# Patient Record
Sex: Female | Born: 1975 | Hispanic: Yes | Smoking: Former smoker
Health system: Southern US, Community
[De-identification: ages and names within clinical notes are randomized; demographics above are authoritative.]

## PROBLEM LIST (undated history)

## (undated) ENCOUNTER — Ambulatory Visit: Admission: EM | Discharge status: Absent without leave | Payer: Self-pay

## (undated) DIAGNOSIS — E559 Vitamin D deficiency, unspecified: Secondary | ICD-10-CM

## (undated) DIAGNOSIS — M533 Sacrococcygeal disorders, not elsewhere classified: Secondary | ICD-10-CM

## (undated) DIAGNOSIS — Z8742 Personal history of other diseases of the female genital tract: Secondary | ICD-10-CM

## (undated) DIAGNOSIS — J45909 Unspecified asthma, uncomplicated: Secondary | ICD-10-CM

## (undated) DIAGNOSIS — Z87828 Personal history of other (healed) physical injury and trauma: Secondary | ICD-10-CM

## (undated) DIAGNOSIS — K219 Gastro-esophageal reflux disease without esophagitis: Secondary | ICD-10-CM

## (undated) HISTORY — DX: Personal history of other diseases of the female genital tract: Z87.42

## (undated) HISTORY — PX: ABDOMINAL HYSTERECTOMY: SHX81

## (undated) HISTORY — DX: Personal history of other (healed) physical injury and trauma: Z87.828

## (undated) HISTORY — DX: Gastro-esophageal reflux disease without esophagitis: K21.9

## (undated) HISTORY — DX: Vitamin D deficiency, unspecified: E55.9

## (undated) HISTORY — DX: Sacrococcygeal disorders, not elsewhere classified: M53.3

## (undated) HISTORY — PX: NASAL POLYP SURGERY: SHX186

## (undated) HISTORY — DX: Unspecified asthma, uncomplicated: J45.909

---

## 2013-12-16 HISTORY — PX: KNEE SURGERY: SHX244

## 2021-09-21 DIAGNOSIS — N838 Other noninflammatory disorders of ovary, fallopian tube and broad ligament: Secondary | ICD-10-CM

## 2021-09-21 HISTORY — DX: Other noninflammatory disorders of ovary, fallopian tube and broad ligament: N83.8

## 2021-10-09 ENCOUNTER — Telehealth: Payer: Self-pay | Admitting: *Deleted

## 2021-10-09 NOTE — Telephone Encounter (Signed)
Spoke with the patient and scheduled a new patient appt for 11/4 at 9 am; patient given an arrival time of 8:30 am. Patient given the address for the clinic

## 2021-10-16 ENCOUNTER — Encounter: Payer: Self-pay | Admitting: Gynecologic Oncology

## 2021-10-16 HISTORY — PX: ABDOMINAL HYSTERECTOMY: SHX81

## 2021-10-17 ENCOUNTER — Telehealth: Payer: Self-pay

## 2021-10-17 NOTE — Telephone Encounter (Signed)
Received message from [facility] scheduling. Patient called and spoke with scheduling to cancel her appointment for Friday 10/19/21. Per scheduler the patient is having emergency surgery in [place] and is leaving the country today. She will call once she is back in the country to reschedule and will send her medical records to the cancer center.

## 2021-10-19 ENCOUNTER — Ambulatory Visit: Payer: Self-pay | Admitting: Gynecologic Oncology

## 2021-10-19 DIAGNOSIS — R1900 Intra-abdominal and pelvic swelling, mass and lump, unspecified site: Secondary | ICD-10-CM

## 2021-10-29 ENCOUNTER — Inpatient Hospital Stay
Admission: RE | Admit: 2021-10-29 | Discharge: 2021-10-29 | Disposition: A | Discharge status: Home / Self Care | Payer: Self-pay | Source: Ambulatory Visit | Attending: Gynecologic Oncology | Admitting: Gynecologic Oncology

## 2021-10-29 ENCOUNTER — Other Ambulatory Visit (HOSPITAL_COMMUNITY): Payer: Self-pay | Admitting: Gynecologic Oncology

## 2021-10-29 DIAGNOSIS — C801 Malignant (primary) neoplasm, unspecified: Secondary | ICD-10-CM

## 2022-04-25 ENCOUNTER — Emergency Department (HOSPITAL_BASED_OUTPATIENT_CLINIC_OR_DEPARTMENT_OTHER)
Admission: EM | Admit: 2022-04-25 | Discharge: 2022-04-25 | Disposition: A | Discharge status: Home / Self Care | Payer: Federal, State, Local not specified - PPO | Attending: Emergency Medicine | Admitting: Emergency Medicine

## 2022-04-25 ENCOUNTER — Emergency Department (HOSPITAL_BASED_OUTPATIENT_CLINIC_OR_DEPARTMENT_OTHER): Payer: Federal, State, Local not specified - PPO

## 2022-04-25 ENCOUNTER — Encounter (HOSPITAL_BASED_OUTPATIENT_CLINIC_OR_DEPARTMENT_OTHER): Payer: Self-pay | Admitting: Urology

## 2022-04-25 ENCOUNTER — Other Ambulatory Visit: Payer: Self-pay

## 2022-04-25 DIAGNOSIS — R600 Localized edema: Secondary | ICD-10-CM | POA: Diagnosis not present

## 2022-04-25 DIAGNOSIS — M7989 Other specified soft tissue disorders: Secondary | ICD-10-CM | POA: Diagnosis not present

## 2022-04-25 DIAGNOSIS — R609 Edema, unspecified: Secondary | ICD-10-CM

## 2022-04-25 LAB — COMPREHENSIVE METABOLIC PANEL
ALT: 12 U/L (ref 0–44)
AST: 16 U/L (ref 15–41)
Albumin: 3.8 g/dL (ref 3.5–5.0)
Alkaline Phosphatase: 62 U/L (ref 38–126)
Anion gap: 6 (ref 5–15)
BUN: 14 mg/dL (ref 6–20)
CO2: 22 mmol/L (ref 22–32)
Calcium: 8.7 mg/dL — ABNORMAL LOW (ref 8.9–10.3)
Chloride: 108 mmol/L (ref 98–111)
Creatinine, Ser: 0.75 mg/dL (ref 0.44–1.00)
GFR, Estimated: >60 mL/min (ref >60)
Glucose, Bld: 114 mg/dL — ABNORMAL HIGH (ref 70–99)
Potassium: 3.7 mmol/L (ref 3.5–5.1)
Sodium: 136 mmol/L (ref 135–145)
Total Bilirubin: 0.5 mg/dL (ref 0.3–1.2)
Total Protein: 7.1 g/dL (ref 6.5–8.1)

## 2022-04-25 LAB — CBC WITH DIFFERENTIAL/PLATELET
Abs Immature Granulocytes: 0.03 10*3/uL (ref 0.00–0.07)
Basophils Absolute: 0.1 10*3/uL (ref 0.0–0.1)
Basophils Relative: 1 %
Eosinophils Absolute: 0.6 10*3/uL — ABNORMAL HIGH (ref 0.0–0.5)
Eosinophils Relative: 7 %
HCT: 42 % (ref 36.0–46.0)
Hemoglobin: 14.3 g/dL (ref 12.0–15.0)
Immature Granulocytes: 0 %
Lymphocytes Relative: 20 %
Lymphs Abs: 1.7 10*3/uL (ref 0.7–4.0)
MCH: 28.3 pg (ref 26.0–34.0)
MCHC: 34 g/dL (ref 30.0–36.0)
MCV: 83.2 fL (ref 80.0–100.0)
Monocytes Absolute: 0.7 10*3/uL (ref 0.1–1.0)
Monocytes Relative: 8 %
Neutro Abs: 5.3 10*3/uL (ref 1.7–7.7)
Neutrophils Relative %: 64 %
Platelets: 317 10*3/uL (ref 150–400)
RBC: 5.05 MIL/uL (ref 3.87–5.11)
RDW: 13.8 % (ref 11.5–15.5)
WBC: 8.4 10*3/uL (ref 4.0–10.5)
nRBC: 0 % (ref 0.0–0.2)

## 2022-04-25 LAB — BRAIN NATRIURETIC PEPTIDE: B Natriuretic Peptide: 32.3 pg/mL (ref 0.0–100.0)

## 2022-04-25 NOTE — ED Notes (Signed)
Pt. Has lower extremity edema from the knee down bilat.     ? ?No reports of chest pain and no reports of shortness of breath noted by the Pt.  ? ?Pt. Reports she had an extremely lon plane ride yesterday from [place] to [place]. ?

## 2022-04-25 NOTE — Discharge Instructions (Addendum)
We discussed the results of your labs on today's visit.  I do recommend you elevate your feet especially after a long day of travel. ? ?Please avoid foods with salt.  You may follow-up with your primary care physician if you need to be started on a fluid pill. ?

## 2022-04-25 NOTE — ED Triage Notes (Signed)
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 ?

## 2022-04-25 NOTE — ED Notes (Signed)
Pt. Has positive pedal pulses bialt. ?

## 2022-04-25 NOTE — ED Provider Notes (Signed)
?[facility] ?Provider Note ? ? ?CSN: [id number] ?Arrival date & time: 04/25/22  1812 ? ?  ? ?History ? ?Chief Complaint  ?Patient presents with  ? Leg Swelling  ? ? ?Suzanne Bowen is a 46 y.o. female. ? ?46 y.o female with a PMH of uterine mass presents to the ED with a chief complaint of bilateral leg swelling that began 4 hours prior to arrival.  Patient reports she does travel from [place], had about a 10-hour day, flying for 6-1/2 hours on air total.  She reports worsening swelling noted to her legs, states "I usually have very skinny legs ".  Reports similar symptoms in the past when she had "her tumor found".  She did not try anything for improvement in her symptoms.  She is concerned as she did not have any follow-up after the removal of her prior benign uterine mass.  She has not follow-up with any oncologist since this visit.  She does not have any prior history of blood clots, no prior history of CAD. No trauma, no shortness of breath or chest pain, no prior hx of CHF.  ? ?The history is provided by the patient and medical records.  ? ?  ? ?Home Medications ?Prior to Admission medications   ?Medication Sig Start Date End Date Taking? Authorizing Provider  ?ALBUTEROL IN Inhale 2 puffs into the lungs every 4 (four) hours as needed.    [provider]  ?Cetirizine HCl (ZYRTEC PO) Take by mouth.    [provider]  ?diphenhydrAMINE HCl (BENADRYL ALLERGY PO) Take by mouth.    [provider]  ?EPINEPHrine 0.3 mg/0.3 mL IJ SOAJ injection Inject 0.3 mg into the muscle as needed for anaphylaxis.    [provider]  ?Fexofenadine HCl (ALLEGRA PO) Take by mouth.    [provider]  ?fluticasone (FLONASE) 50 MCG/ACT nasal spray Place into both nostrils daily.    [provider]  ?   ? ?Allergies    ?Patient has no known allergies.   ? ?Review of Systems   ?Review of Systems  ?Constitutional:  Negative for chills and fever.  ?HENT:   Negative for sore throat.   ?Respiratory:  Positive for shortness of breath.   ?Cardiovascular:  Positive for leg swelling. Negative for chest pain.  ?Gastrointestinal:  Negative for abdominal pain, nausea and vomiting.  ?Genitourinary:  Negative for flank pain.  ?Neurological:  Negative for headaches.  ?All other systems reviewed and are negative. ? ?Physical Exam ?Updated Vital Signs ?BP 135/80 (BP Location: Right Arm)   Pulse 78   Temp 98.1 ?F (36.7 ?C) (Oral)   Resp 20   Ht '5\' 10"'$  (1.778 m)   Wt 95.3 kg   SpO2 100%   BMI 30.13 kg/m?  ?Physical Exam ?Vitals and nursing note reviewed.  ?Constitutional:   ?   Appearance: Normal appearance.  ?HENT:  ?   Head: Normocephalic and atraumatic.  ?   Mouth/Throat:  ?   Mouth: Mucous membranes are moist.  ?Cardiovascular:  ?   Rate and Rhythm: Normal rate.  ?   Pulses:     ?     Popliteal pulses are 2+ on the right side and 2+ on the left side.  ?     Dorsalis pedis pulses are 2+ on the right side and 2+ on the left side.  ?   Comments: No BL pitting edema. No calf tenderness. Reports more swollen than baseline.  ?Pulmonary:  ?  Effort: Pulmonary effort is normal.  ?Abdominal:  ?   General: Abdomen is flat.  ?Musculoskeletal:     ?   General: No swelling or tenderness.  ?   Cervical back: Normal range of motion and neck supple.  ?   Right lower leg: No edema.  ?   Left lower leg: No edema.  ?Skin: ?   General: Skin is warm and dry.  ?Neurological:  ?   Mental Status: She is alert and oriented to person, place, and time.  ? ? ?ED Results / Procedures / Treatments   ?Labs ?(all labs ordered are listed, but only abnormal results are displayed) ?Labs Reviewed  ?CBC WITH DIFFERENTIAL/PLATELET  ?COMPREHENSIVE METABOLIC PANEL  ?BRAIN NATRIURETIC PEPTIDE  ? ? ?EKG ?None ? ?Radiology ?No results found. ? ?Procedures ?Procedures  ? ? ?Medications Ordered in ED ?Medications - No data to display ? ?ED Course/ Medical Decision Making/ A&P ?  ?                        ?Medical  Decision Making ?Patient presents to the ED with bilateral leg swelling that is been ongoing for the past 4 hours, recently traveled from [place], states she had a total travel of 6.5 hours, but had a total number of 10 hours while at the airport.  She is concerned as she feels some tingling to bilateral legs.  She denies any trauma.  She is concerned as prior symptoms are similar in the past when she was diagnosed with a uterine mass which was then removed.  She has not follow-up with oncology since although this mass was benign. ? ?During evaluation she is nontoxic, non-ill-appearing.  Lungs are clear to auscultation she is without any shortness of breath or chest pain at this time.  Bilateral legs noted to be swollen, however without any edema noted, no calf tenderness bilaterally she is able to fully range bilateral lower extremities with good strength.  Currently on OCPs that she had a partial hysterectomy, denies any prior history of blood clots.  No fevers, changes in her skin to suggest cellulitis. ? ?We did discuss ruling out DVT versus CHF versus just peripheral edema. ?BNP is in normal limits, DVT study is negative for any thrombus.  Patient did ask about swelling, I do believe that this will improve her elevation and discontinue some sodium.  I did discuss with her that I am unable to place her on medication such as Lasix as patient does not have any PCP for follow-up and cannot get repeat anabolic panels drawn. ? ?We did discuss follow-up while she returns to [place] along with follow-up with [facility] oncologist.  She is in stable condition ready for discharge. ? ?Amount and/or Complexity of Data Reviewed ?Labs: ordered. ?Radiology: ordered. ? ?Risk ?Diagnosis or treatment significantly limited by social determinants of health. ? ?This patient presents to the ED for concern of leg swelling, this involves a number of treatment options, and is a complaint that carries with it a high risk of complications and  morbidity.  The differential diagnosis includes DVT, cellulitis, trauma, peripheral edema.  ? ? ?Co morbidities: ?Discussed in HPI ? ? ?Lab Tests: ? ?I ordered and independently interpreted labs.  The pertinent results include:   ? ?I personally reviewed all laboratory work and imaging. Metabolic panel without any acute abnormality specifically kidney function within normal limits and no significant electrolyte abnormalities. CBC without leukocytosis or significant anemia. ? ? ?Imaging Studies: ? ?  DVT study did not show any obvious thrombus. ? ? ?Medicines ordered: ? ?N/A ? ?Social Determinants of Health: ? ?The patient's social determinants of health were a factor in the care of this patient ? ? ?Dispostion: ? ?After consideration of the diagnostic results and the patients response to treatment, I feel that the patent would benefit from patient follow-up with PCP and Dr. Berline Lopes primary oncologist from prior uterine mass. ?  ? ?Portions of this note were generated with [employer]. Dictation errors may occur despite best attempts at proofreading.   ?Final Clinical Impression(s) / ED Diagnoses ?Final diagnoses:  ?Peripheral edema  ? ? ?Rx / DC Orders ?ED Discharge Orders   ? ? None  ? ?  ? ? ?  ?Janeece Fitting, PA-C ?04/25/22 2023 ? ?  ?Malvin Johns, MD ?04/25/22 2306 ? ?

## 2022-04-25 NOTE — ED Notes (Signed)
Family updated as to patient's status.

## 2022-06-06 ENCOUNTER — Encounter: Payer: Self-pay | Admitting: Internal Medicine

## 2022-06-06 ENCOUNTER — Ambulatory Visit (INDEPENDENT_AMBULATORY_CARE_PROVIDER_SITE_OTHER): Payer: Federal, State, Local not specified - PPO | Admitting: Internal Medicine

## 2022-06-06 VITALS — BP 110/60 | HR 71 | Temp 98.0°F | Resp 17 | Wt 239.9 lb

## 2022-06-06 DIAGNOSIS — J309 Allergic rhinitis, unspecified: Secondary | ICD-10-CM | POA: Diagnosis not present

## 2022-06-06 DIAGNOSIS — J302 Other seasonal allergic rhinitis: Secondary | ICD-10-CM

## 2022-06-06 DIAGNOSIS — H1013 Acute atopic conjunctivitis, bilateral: Secondary | ICD-10-CM | POA: Diagnosis not present

## 2022-06-06 DIAGNOSIS — H1010 Acute atopic conjunctivitis, unspecified eye: Secondary | ICD-10-CM

## 2022-06-06 DIAGNOSIS — J4520 Mild intermittent asthma, uncomplicated: Secondary | ICD-10-CM | POA: Diagnosis not present

## 2022-06-06 MED ORDER — MONTELUKAST SODIUM 10 MG PO TABS: 10.0000 mg | ORAL_TABLET | Freq: Every day | ORAL | 1 refills | Status: DC

## 2022-06-06 MED ORDER — ALBUTEROL SULFATE HFA 108 (90 BASE) MCG/ACT IN AERS: 2.0000 | INHALATION_SPRAY | Freq: Four times a day (QID) | RESPIRATORY_TRACT | 2 refills | Status: AC | PRN

## 2022-06-06 MED ORDER — FEXOFENADINE HCL 180 MG PO TABS
180.0000 mg | ORAL_TABLET | Freq: Every day | ORAL | 1 refills | Status: DC
Start: 2022-06-06 — End: 2023-02-14

## 2022-06-06 NOTE — Progress Notes (Signed)
New Patient Note  RE: Ernestyne Caldwell MRN: [id number] DOB: 24-Jun-1976 Date of Office Visit: 06/06/2022  Consult requested by: No ref. provider found Primary care provider: Pcp, No  Chief Complaint: Other (Pt states she is present to find a doctor to take over her care of allergic rhinitis, and AIT shots just move here) and Allergic Rhinitis   History of Present Illness: I had the pleasure of seeing Keyonni Knope for initial evaluation at the [facility] [place] [facility] [place] on 06/06/2022. She is a 46 y.o. female, who is referred here by Pcp, No for the evaluation of allergic rhinitis.  History obtained from patient.  Chronic rhinitis: started as a child  Symptoms include: nasal congestion, rhinorrhea, post nasal drainage, sneezing, watery eyes, itchy eyes, and itchy nose  Occurs year-round, with seasonal flares in springtime and with dog exposure  Potential triggers: pollen season, cats and dogs  Treatments tried: benadryl, unknown nose sprays (will get tried after nose sprays)  AIT: Started on 04/2022: does have mild local reactions,  Previous allergy testing:  outside testing: T, G, W, C, D, Dm, M History of reflux/heartburn: no History of chronic sinusitis or sinus surgery:  2 previous sinus surgeries (2009. 2012)  Nonallergic triggers:  Denies     Has a history of exercise induced chest tightness and cough.  Was started on montelukast and albuterol for 6 months with complete resolution of symptoms.  Has stopped exercising so she does not know if symptoms would recur.     Assessment and Plan: Patryce is a 46 y.o. female with: Seasonal and perennial allergic rhinitis  Seasonal allergic conjunctivitis  Mild intermittent asthma without complication - Plan: Spirometry with Graph Plan: Patient Instructions  Seasonal and perennial rhinitis: Moderately controlled  - Continue Avoidance measures for trees, grass, weeds, cat, dog, mold, dust mite - AIT PLAN: We will continue  with once a week buildup on current vials.  -Based on outside prescription she will reached maintenance at  0.5 mL of red vial   -Continue to carry EpiPen on shot days  -Once your vials expire we will need to get refills from previous allergist  - Stop taking: Benadryl  - Start taking: Allegra (fexofenadine) '180mg'$  table once daily and Singulair (montelukast) '10mg'$  daily - You can use an extra dose of the antihistamine, if needed, for breakthrough symptoms.  - Consider nasal saline rinses 1-2 times daily to remove allergens from the nasal cavities as well as help with mucous clearance (this is especially helpful to do before the nasal sprays are given) - Consider allergy shots as a means of long-term control and can reduce lifetime use of medications  - We can discuss more at the next appointment if the medications are not working for you.  Mild intermittent asthma: Well controlled - Breathing test today showed: Looked great  PLAN: . - Daily controller medication(s): Singulair '10mg'$  daily - Prior to physical activity: albuterol 2 puffs 10-15 minutes before physical activity. - Rescue medications: albuterol 4 puffs every 4-6 hours as needed - Asthma control goals:  * Full participation in all desired activities (may need albuterol before activity) * Albuterol use two time or less a week on average (not counting use with activity) * Cough interfering with sleep two time or less a month * Oral steroids no more than once a year * No hospitalizations  Follow up: 6 months   Thank you so much for letting me partake in your care today.  Don't hesitate to  reach out if you have any additional concerns!  Roney Marion, MD  [facility] [place] [place]    No follow-ups on file.  Meds ordered this encounter  Medications   albuterol (VENTOLIN HFA) 108 (90 Base) MCG/ACT inhaler    Sig: Inhale 2 puffs into the lungs every 6 (six) hours as needed for wheezing or shortness of  breath.    Dispense:  8 g    Refill:  2   montelukast (SINGULAIR) 10 MG tablet    Sig: Take 1 tablet (10 mg total) by mouth at bedtime.    Dispense:  90 tablet    Refill:  1   fexofenadine (ALLEGRA) 180 MG tablet    Sig: Take 1 tablet (180 mg total) by mouth daily.    Dispense:  90 tablet    Refill:  1   Lab Orders  No laboratory test(s) ordered today    Other allergy screening: Asthma:  YES  Rhino conjunctivitis: yes Food allergy: no Medication allergy: no Hymenoptera allergy: no Urticaria: no Eczema:no History of recurrent infections suggestive of immunodeficency: no  Diagnostics: Spirometry:  Tracings reviewed. Her effort: Good reproducible efforts. FVC: 3.2 L FEV1: 2.58 L, 80% predicted FEV1/FVC ratio: 81% Interpretation: Spirometry consistent with normal pattern.  Please see scanned spirometry results for details.   Results interpreted by myself and discussed with patient/family.   Past Medical History: There are no problems to display for this patient.  Past Medical History:  Diagnosis Date   History of PCOS    History of torn meniscus of left knee    Ovarian mass, right 09/21/2021   Sacroiliac pain    Past Surgical History: Past Surgical History:  Procedure Laterality Date   ABDOMINAL HYSTERECTOMY     Medication List:  Current Outpatient Medications  Medication Sig Dispense Refill   albuterol (VENTOLIN HFA) 108 (90 Base) MCG/ACT inhaler Inhale 2 puffs into the lungs every 6 (six) hours as needed for wheezing or shortness of breath. 8 g 2   ALBUTEROL IN Inhale 2 puffs into the lungs every 4 (four) hours as needed.     budesonide-formoterol (SYMBICORT) 160-4.5 MCG/ACT inhaler Inhale into the lungs.     Cetirizine HCl (ZYRTEC PO) Take by mouth.     diphenhydrAMINE HCl (BENADRYL ALLERGY PO) Take by mouth.     EPINEPHrine 0.3 mg/0.3 mL IJ SOAJ injection Inject 0.3 mg into the muscle as needed for anaphylaxis.     fexofenadine (ALLEGRA) 180 MG tablet  Take 1 tablet (180 mg total) by mouth daily. 90 tablet 1   Fexofenadine HCl (ALLEGRA PO) Take by mouth.     fluticasone (FLONASE) 50 MCG/ACT nasal spray Place into both nostrils daily.     montelukast (SINGULAIR) 10 MG tablet Take 1 tablet (10 mg total) by mouth at bedtime. 90 tablet 1   No current facility-administered medications for this visit.   Allergies: No Known Allergies Social History: Social History   Socioeconomic History   Marital status: Single    Spouse name: Not on file   Number of children: Not on file   Years of education: Not on file   Highest education level: Not on file  Occupational History   Not on file  Tobacco Use   Smoking status: Never   Smokeless tobacco: Never  Substance and Sexual Activity   Alcohol use: Yes    Comment: socially   Drug use: Never   Sexual activity: Not on file  Other Topics Concern  Not on file  Social History Narrative   Not on file   Social Determinants of Health   Financial Resource Strain: Not on file  Food Insecurity: Not on file  Transportation Needs: Not on file  Physical Activity: Not on file  Stress: Not on file  Social Connections: Not on file   Lives in a single-family home that is 47 years old.  There are no roaches in the house and bed is 2 feet off the floor.  There are no dust mite precautions on better pillow.  She is exposed to fumes, chemicals and dust through her job.  She does not have a HEPA filter and does not live near an interstate industrial area. Smoking: No exposure Occupation: Works as an Education officer, museum History: Environmental education officer in the house: no Charity fundraiser in the family room: no Carpet in the bedroom: no Heating: electric Cooling:  window Pet: yes dogs outside the building, cats located inside of building but not bedroom.   Family History: History reviewed. No pertinent family history.   ROS: All others negative except as noted per HPI.   Objective: BP  110/60   Pulse 71   Temp 98 F (36.7 C) (Temporal)   Resp 17   Wt 239 lb 14.4 oz (108.8 kg)   SpO2 98%   BMI 34.42 kg/m  Body mass index is 34.42 kg/m.  General Appearance:  Alert, cooperative, no distress, appears stated age  Head:  Normocephalic, without obvious abnormality, atraumatic  Eyes:  Conjunctiva clear, EOM's intact  Nose: Nares normal,  erythematous nasal mucosa with green rhinnorhea, hypertrophic turbinates, no visible anterior polyps, and septum midline  Throat: Lips, tongue normal; teeth and gums normal, + cobblestoning  Neck: Supple, symmetrical  Lungs:   clear to auscultation bilaterally, Respirations unlabored, no coughing  Heart:  regular rate and rhythm and no murmur, Appears well perfused  Extremities: No edema  Skin: Skin color, texture, turgor normal, no rashes or lesions on visualized portions of skin  Neurologic: No gross deficits   The plan was reviewed with the patient/family, and all questions/concerned were addressed.  It was my pleasure to see Annabell today and participate in her care. Please feel free to contact me with any questions or concerns.  Sincerely,  Roney Marion, MD Allergy & Immunology  [facility] office: [phone] [facility] office: [phone]

## 2022-06-06 NOTE — Patient Instructions (Addendum)
Seasonal and perennial rhinitis: Moderately controlled  - Continue Avoidance measures for trees, grass, weeds, cat, dog, mold, dust mite - AIT PLAN: We will continue with once a week buildup on current vials.  -Based on outside prescription she will reached maintenance at  0.5 mL of red vial   -Continue to carry EpiPen on shot days  -Once your vials expire we will need to get refills from previous allergist  - Stop taking: Benadryl  - Start taking: Allegra (fexofenadine) '180mg'$  table once daily and Singulair (montelukast) '10mg'$  daily - You can use an extra dose of the antihistamine, if needed, for breakthrough symptoms.  - Consider nasal saline rinses 1-2 times daily to remove allergens from the nasal cavities as well as help with mucous clearance (this is especially helpful to do before the nasal sprays are given) - Consider allergy shots as a means of long-term control and can reduce lifetime use of medications  - We can discuss more at the next appointment if the medications are not working for you.  Mild intermittent asthma: Well controlled - Breathing test today showed: Looked great  PLAN: . - Daily controller medication(s): Singulair '10mg'$  daily - Prior to physical activity: albuterol 2 puffs 10-15 minutes before physical activity. - Rescue medications: albuterol 4 puffs every 4-6 hours as needed - Asthma control goals:  * Full participation in all desired activities (may need albuterol before activity) * Albuterol use two time or less a week on average (not counting use with activity) * Cough interfering with sleep two time or less a month * Oral steroids no more than once a year * No hospitalizations  Follow up: 6 months   Thank you so much for letting me partake in your care today.  Don't hesitate to reach out if you have any additional concerns!  Roney Marion, MD  [facility] [place], [place]

## 2022-06-07 ENCOUNTER — Other Ambulatory Visit: Payer: Self-pay

## 2022-06-07 ENCOUNTER — Emergency Department (HOSPITAL_BASED_OUTPATIENT_CLINIC_OR_DEPARTMENT_OTHER): Payer: Federal, State, Local not specified - PPO

## 2022-06-07 ENCOUNTER — Encounter (HOSPITAL_BASED_OUTPATIENT_CLINIC_OR_DEPARTMENT_OTHER): Payer: Self-pay

## 2022-06-07 ENCOUNTER — Emergency Department (HOSPITAL_BASED_OUTPATIENT_CLINIC_OR_DEPARTMENT_OTHER)
Admission: EM | Admit: 2022-06-07 | Discharge: 2022-06-07 | Disposition: A | Discharge status: Home / Self Care | Payer: Federal, State, Local not specified - PPO | Attending: Emergency Medicine | Admitting: Emergency Medicine

## 2022-06-07 DIAGNOSIS — W19XXXA Unspecified fall, initial encounter: Secondary | ICD-10-CM

## 2022-06-07 DIAGNOSIS — W109XXA Fall (on) (from) unspecified stairs and steps, initial encounter: Secondary | ICD-10-CM | POA: Insufficient documentation

## 2022-06-07 DIAGNOSIS — S99912A Unspecified injury of left ankle, initial encounter: Secondary | ICD-10-CM | POA: Diagnosis not present

## 2022-06-07 DIAGNOSIS — M791 Myalgia, unspecified site: Secondary | ICD-10-CM

## 2022-06-07 DIAGNOSIS — M79642 Pain in left hand: Secondary | ICD-10-CM | POA: Diagnosis not present

## 2022-06-07 DIAGNOSIS — S93402A Sprain of unspecified ligament of left ankle, initial encounter: Secondary | ICD-10-CM | POA: Diagnosis not present

## 2022-06-07 DIAGNOSIS — Z043 Encounter for examination and observation following other accident: Secondary | ICD-10-CM | POA: Diagnosis not present

## 2022-06-07 DIAGNOSIS — Y9301 Activity, walking, marching and hiking: Secondary | ICD-10-CM | POA: Diagnosis not present

## 2022-06-07 DIAGNOSIS — M7989 Other specified soft tissue disorders: Secondary | ICD-10-CM | POA: Diagnosis not present

## 2022-06-07 MED ORDER — METHOCARBAMOL 500 MG PO TABS: 500.0000 mg | ORAL_TABLET | Freq: Every evening | ORAL | 0 refills | Status: AC | PRN

## 2022-06-07 MED ORDER — IBUPROFEN 800 MG PO TABS
800.0000 mg | ORAL_TABLET | Freq: Once | ORAL | Status: AC
  Administered 2022-06-07: 800 mg via ORAL
  Filled 2022-06-07: qty 1

## 2022-06-07 NOTE — ED Triage Notes (Signed)
Patient states she was walking down the stairs and missed a step and fell. Patient c/o left hand pain and left leg pain - able to walk but states is painful.

## 2022-06-14 ENCOUNTER — Ambulatory Visit (INDEPENDENT_AMBULATORY_CARE_PROVIDER_SITE_OTHER): Payer: Federal, State, Local not specified - PPO

## 2022-06-14 DIAGNOSIS — J309 Allergic rhinitis, unspecified: Secondary | ICD-10-CM

## 2022-06-19 ENCOUNTER — Ambulatory Visit (INDEPENDENT_AMBULATORY_CARE_PROVIDER_SITE_OTHER): Payer: Federal, State, Local not specified - PPO

## 2022-06-19 DIAGNOSIS — J309 Allergic rhinitis, unspecified: Secondary | ICD-10-CM | POA: Diagnosis not present

## 2022-09-05 ENCOUNTER — Telehealth: Payer: Self-pay

## 2022-09-05 NOTE — Telephone Encounter (Signed)
Patient notified of vial concentration change due to length of time of last injection.  Vials will be diluted and ready to restart next week.  Also informed patient the vials will expire in December, so she will need retesting done unless she chooses to make yearly visit to old allergist in [place].

## 2022-09-10 ENCOUNTER — Encounter (HOSPITAL_BASED_OUTPATIENT_CLINIC_OR_DEPARTMENT_OTHER): Payer: Self-pay | Admitting: Emergency Medicine

## 2022-09-10 ENCOUNTER — Other Ambulatory Visit: Payer: Self-pay

## 2022-09-10 ENCOUNTER — Emergency Department (HOSPITAL_BASED_OUTPATIENT_CLINIC_OR_DEPARTMENT_OTHER)
Admission: EM | Admit: 2022-09-10 | Discharge: 2022-09-10 | Disposition: A | Discharge status: Home / Self Care | Payer: Federal, State, Local not specified - PPO | Attending: Emergency Medicine | Admitting: Emergency Medicine

## 2022-09-10 DIAGNOSIS — S71151A Open bite, right thigh, initial encounter: Secondary | ICD-10-CM | POA: Diagnosis not present

## 2022-09-10 DIAGNOSIS — S81831A Puncture wound without foreign body, right lower leg, initial encounter: Secondary | ICD-10-CM | POA: Insufficient documentation

## 2022-09-10 DIAGNOSIS — S8991XA Unspecified injury of right lower leg, initial encounter: Secondary | ICD-10-CM | POA: Diagnosis not present

## 2022-09-10 DIAGNOSIS — W540XXA Bitten by dog, initial encounter: Secondary | ICD-10-CM | POA: Insufficient documentation

## 2022-09-10 DIAGNOSIS — S81851A Open bite, right lower leg, initial encounter: Secondary | ICD-10-CM | POA: Diagnosis not present

## 2022-09-10 MED ORDER — AMOXICILLIN-POT CLAVULANATE 875-125 MG PO TABS: 1.0000 | ORAL_TABLET | Freq: Two times a day (BID) | ORAL | 0 refills | Status: DC

## 2022-09-10 NOTE — ED Triage Notes (Signed)
Pt reports dog bite to RLE; dog is unknown to pt; owners said the dog is UTD on vaccines

## 2022-09-10 NOTE — ED Notes (Signed)
Wound cleaned per provider.

## 2022-09-10 NOTE — ED Notes (Signed)
Dog bite to the back of the right thigh. Patient states owner said Dog is vaccinated. 3 puncture wounds noted to the back of thigh. No active bleeding noted . Pt last tetanus was in November.

## 2022-09-10 NOTE — ED Provider Notes (Signed)
[facility] EMERGENCY DEPARTMENT Provider Note   CSN: [id number] Arrival date & time: 09/10/22  1940     History  Chief Complaint  Patient presents with   Animal Bite    Suzanne Bowen is a 46 y.o. female.  With no relevant past medical history who presents to the ED for evaluation of dog bite.  Reports dog bite happened at approximately 3 PM today.  Dog is not known to her.  States the owner believes vaccines are up-to-date.  Currently complaining of mild pain to the puncture site.  No numbness or tingling in the affected area.  No fevers or chills.   Animal Bite      Home Medications Prior to Admission medications   Medication Sig Start Date End Date Taking? Authorizing Provider  amoxicillin-clavulanate (AUGMENTIN) 875-125 MG tablet Take 1 tablet by mouth every 12 (twelve) hours. 09/10/22  Yes Curstin Schmale, Grafton Folk, PA-C  albuterol (VENTOLIN HFA) 108 (90 Base) MCG/ACT inhaler Inhale 2 puffs into the lungs every 6 (six) hours as needed for wheezing or shortness of breath. 06/06/22   Roney Marion, MD  ALBUTEROL IN Inhale 2 puffs into the lungs every 4 (four) hours as needed.    [provider]  budesonide-formoterol (SYMBICORT) 160-4.5 MCG/ACT inhaler Inhale into the lungs. 05/06/22   [provider]  Cetirizine HCl (ZYRTEC PO) Take by mouth.    [provider]  diphenhydrAMINE HCl (BENADRYL ALLERGY PO) Take by mouth.    [provider]  EPINEPHrine 0.3 mg/0.3 mL IJ SOAJ injection Inject 0.3 mg into the muscle as needed for anaphylaxis.    [provider]  fexofenadine (ALLEGRA) 180 MG tablet Take 1 tablet (180 mg total) by mouth daily. 06/06/22   Roney Marion, MD  Fexofenadine HCl (ALLEGRA PO) Take by mouth.    [provider]  fluticasone (FLONASE) 50 MCG/ACT nasal spray Place into both nostrils daily.    [provider]  methocarbamol (ROBAXIN) 500 MG tablet Take 1 tablet (500 mg total) by mouth at  bedtime as needed for muscle spasms. 06/07/22   Caccavale, Sophia, PA-C  montelukast (SINGULAIR) 10 MG tablet Take 1 tablet (10 mg total) by mouth at bedtime. 06/06/22   Roney Marion, MD      Allergies    Patient has no known allergies.    Review of Systems   Review of Systems  Skin:  Positive for wound.  All other systems reviewed and are negative.   Physical Exam Updated Vital Signs BP (!) 140/89 (BP Location: Right Arm)   Pulse 80   Temp 98.3 F (36.8 C) (Oral)   Resp 20   Ht '5\' 11"'$  (1.803 m)   Wt 108.9 kg   SpO2 99%   BMI 33.47 kg/m  Physical Exam Vitals and nursing note reviewed.  Constitutional:      General: She is not in acute distress.    Appearance: Normal appearance. She is normal weight. She is not ill-appearing.  HENT:     Head: Normocephalic and atraumatic.  Pulmonary:     Effort: Pulmonary effort is normal. No respiratory distress.  Abdominal:     General: Abdomen is flat.  Musculoskeletal:        General: Normal range of motion.     Cervical back: Neck supple.  Skin:    General: Skin is warm and dry.     Capillary Refill: Capillary refill takes less than 2 seconds.          Comments:  Tiny puncture wound to the right posterior lower extremity with no bleeding, drainage or surrounding erythema.  Neurological:     Mental Status: She is alert and oriented to person, place, and time.  Psychiatric:        Mood and Affect: Mood normal.        Behavior: Behavior normal.     ED Results / Procedures / Treatments   Labs (all labs ordered are listed, but only abnormal results are displayed) Labs Reviewed - No data to display  EKG None  Radiology No results found.  Procedures Procedures    Medications Ordered in ED Medications - No data to display  ED Course/ Medical Decision Making/ A&P                           Medical Decision Making Risk Prescription drug management.  This patient presents to the ED for concern of dog  bite   Additional history obtained from: Nursing notes from this visit.   Afebrile, hemodynamically stable.  Patient is a 47 year old female who presents ED for evaluation of dog bite to the right lower extremity.  Physical exam shows a single tiny puncture wound.  Not large enough to close.  Wound was thoroughly cleansed while in the ED.  We will send him a prescription for Augmentin.  Advised patient to follow-up with her primary care provider.  Given return precautions. stable at discharge.  At this time there does not appear to be any evidence of an acute emergency medical condition and the patient appears stable for discharge with appropriate outpatient follow up. Diagnosis was discussed with patient who verbalizes understanding of care plan and is agreeable to discharge. I have discussed return precautions with patient who verbalizes understanding. Patient encouraged to follow-up with their PCP within 1 week. All questions answered.  Note: Portions of this report may have been transcribed using voice recognition software. Every effort was made to ensure accuracy; however, inadvertent computerized transcription errors may still be present.          Final Clinical Impression(s) / ED Diagnoses Final diagnoses:  Dog bite, initial encounter    Rx / DC Orders ED Discharge Orders          Ordered    amoxicillin-clavulanate (AUGMENTIN) 875-125 MG tablet  Every 12 hours        09/10/22 2248              Roylene Reason, Hershal Coria 09/10/22 2337    Long, Wonda Olds, MD 09/16/22 1204

## 2022-09-10 NOTE — Discharge Instructions (Addendum)
You have been seen today for your complaint of dog bite. Your discharge medications include Augmentin.  This is an antibiotic.  You should take it as prescribed.  You should take it for the entire duration of the prescription.  It may cause an upset stomach.  You should take it with food.. Home care instructions are as follows:  Keep the wound clean and dry for 24 hours.  After that you may wash with warm soap and water daily.  Do not submerge the wound until it is fully healed. Follow up with: Your primary care provider in 1 week Please seek immediate medical care if you develop any of the following symptoms: You have a red streak that leads away from your wound. You have non-clear fluid or more blood coming from your wound. There is pus or a bad smell coming from your wound. You have trouble moving your injured area. You have numbness or tingling that spreads beyond your wound. At this time there does not appear to be the presence of an emergent medical condition, however there is always the potential for conditions to change. Please read and follow the below instructions.  Do not take your medicine if  develop an itchy rash, swelling in your mouth or lips, or difficulty breathing; call 911 and seek immediate emergency medical attention if this occurs.  You may review your lab tests and imaging results in their entirety on your MyChart account.  Please discuss all results of fully with your primary care provider and other specialist at your follow-up visit.  Note: Portions of this text may have been transcribed using voice recognition software. Every effort was made to ensure accuracy; however, inadvertent computerized transcription errors may still be present.

## 2022-09-12 ENCOUNTER — Ambulatory Visit: Payer: Self-pay

## 2022-09-12 ENCOUNTER — Ambulatory Visit (INDEPENDENT_AMBULATORY_CARE_PROVIDER_SITE_OTHER): Payer: Federal, State, Local not specified - PPO | Admitting: Family

## 2022-09-12 ENCOUNTER — Encounter: Payer: Self-pay | Admitting: Family

## 2022-09-12 VITALS — BP 124/76 | HR 75 | Temp 97.7°F | Resp 18 | Wt 242.1 lb

## 2022-09-12 DIAGNOSIS — H1013 Acute atopic conjunctivitis, bilateral: Secondary | ICD-10-CM

## 2022-09-12 DIAGNOSIS — J309 Allergic rhinitis, unspecified: Secondary | ICD-10-CM

## 2022-09-12 DIAGNOSIS — J4520 Mild intermittent asthma, uncomplicated: Secondary | ICD-10-CM | POA: Diagnosis not present

## 2022-09-12 DIAGNOSIS — J019 Acute sinusitis, unspecified: Secondary | ICD-10-CM | POA: Diagnosis not present

## 2022-09-12 DIAGNOSIS — H1010 Acute atopic conjunctivitis, unspecified eye: Secondary | ICD-10-CM

## 2022-09-12 DIAGNOSIS — J302 Other seasonal allergic rhinitis: Secondary | ICD-10-CM

## 2022-09-12 MED ORDER — PREDNISONE 10 MG PO TABS: ORAL_TABLET | ORAL | 0 refills | Status: DC

## 2022-09-12 MED ORDER — MONTELUKAST SODIUM 10 MG PO TABS: 10.0000 mg | ORAL_TABLET | Freq: Every day | ORAL | 1 refills | Status: DC

## 2022-09-12 NOTE — Patient Instructions (Addendum)
Acute sinus infection Start Augmentin 875 mg taking 1 tablet twice a day for 7 days that was prescribed by the ER on 09/10/22 for your dog bite. This antibiotic will also work for your sinuses. Make sure to pick this up from the pharmacy Start prednisone 10 mg taking 1 tablet twice a day for 4 days, then on the fifth day take 1 tablet and stop  Seasonal and perennial rhinitis: - Continue Avoidance measures for trees, grass, weeds, cat, dog, mold, dust mite - AIT PLAN: We will continue with once a week buildup on current vials. Do not get your allergy injections while you are sick    -Continue to carry EpiPen on shot days  -Once your vials expire consider getting skin testing at our office since you previously lived in [place] and this is where your serum is from - Continue Zyrtec (cetirizine) 10 mg table once daily andrestart Singulair (montelukast) '10mg'$  daily - You can use an extra dose of the antihistamine, if needed, for breakthrough symptoms.  - Consider nasal saline rinses 1-2 times daily to remove allergens from the nasal cavities as well as help with mucous clearance (this is especially helpful to do before the nasal sprays are given) - Consider allergy shots as a means of long-term control and can reduce lifetime use of medications  - We can discuss more at the next appointment if the medications are not working for you.  Mild intermittent asthma:  PLAN: . - Daily controller medication(s):Restart Singulair '10mg'$  daily - Prior to physical activity: albuterol 2 puffs 10-15 minutes before physical activity. - Rescue medications: albuterol 4 puffs every 4-6 hours as needed - Asthma control goals:  * Full participation in all desired activities (may need albuterol before activity) * Albuterol use two time or less a week on average (not counting use with activity) * Cough interfering with sleep two time or less a month * Oral steroids no more than once a year * No hospitalizations  We will  refer you to [facility] for possible sleep apnea due to witnessed apneas and fatigue when waking up in the morning  Follow up: 2 months or sooner if needed

## 2022-09-12 NOTE — Progress Notes (Signed)
[place] [address] Dept: [phone]  FOLLOW UP NOTE  Patient ID: Suzanne Bowen, female    DOB: [birth year]  Age: 46 y.o. MRN: [id number] Date of Office Visit: 09/12/2022  Assessment  Chief Complaint: Follow-up and Sinus Problem (Follow up Recurrent sinus infections, surgery twice feels septum moving has had polyps removed )  HPI Suzanne Bowen is a 46 year old female who presents today for an acute visit of possible sinus infection.  She was last seen on June 06, 2022 by Dr. Edison Pace for seasonal and perennial allergic rhinitis and mild intermittent asthma.   Since her last office visit she did go to the emergency room on September 26 for dog bite.  She has not picked up the Augmentin that was prescribed yet.  Seasonal and perennial allergic rhinitis: She reports nasal congestion, postnasal drip, sinus pressure, headache, and rhinorrhea that is green in color.  She denies fever.  She reports that most of the time when she blows her nose she will see green.  It has been ongoing for a while.  Occasionally when she sneezes what comes out will be clear in color.  She continues to take Zyrtec 10 mg once a day, but mentions that over-the-counter allergy pills only work for her 1 week and then they become ineffective.  She has been out of Singulair 10 mg for 2 months.  She continues to receive allergy injections based on her skin testing when she lived in [place].  She is not able to use nasal sprays due to them making her tired and short of breath.  She feels like she has ran a marathon after using nasal sprays.  She has tried every nasal spray that is on the market.  She will occasionally use fluticasone nasal spray.  She has had 2 previous sinus surgeries and plans on having a another one next May.  She mentions that the first surgery was for polyps and that they tried to remove the septum, but it moved again.  She receives antibiotics for sinus infection approximately 2 times  a year.  Mild intermittent asthma she is currently been out of Singulair 10 mg once a day for at least 2 months.  She does have albuterol to use as needed.  She reports coughing and wheezing sometimes and tightness in her chest and shortness of breath if running.  She uses her albuterol sometimes 2 times a week and other times she does not use it in a 2-week time span.  Since her last office visit she has not required any systemic steroids or made any trips to the emergency room or urgent care due to breathing problems.  She mentions that her girlfriend often sees her stopping breathing at night and some days she wakes up really tired.  She has not been evaluated for sleep apnea.  She does not have a primary care physician.   Drug Allergies:  Not on File  Review of Systems: Review of Systems  Constitutional:  Negative for chills and fever.  HENT:         Reports green rhinorrhea, nasal congestion, postnasal drip, and sinus pressure  Eyes:        Reports itchy watery eyes  Respiratory:  Positive for cough, shortness of breath and wheezing.        Reports coughing and wheezing sometimes.  She also reports tightness in her chest and shortness of breath if running.  Cardiovascular:  Positive for chest pain and palpitations.  Reports chest pain and palpitation at times. She does not have a primary care physician  Gastrointestinal:        Denies heartburn or reflux symptoms  Genitourinary:  Negative for frequency.  Skin:  Negative for itching and rash.  Neurological:  Negative for headaches.  Endo/Heme/Allergies:  Positive for environmental allergies.     Physical Exam: BP 124/76 (BP Location: Left Arm, Patient Position: Sitting, Cuff Size: Normal)   Pulse 75   Temp 97.7 F (36.5 C) (Temporal)   Resp 18   Wt 242 lb 1.6 oz (109.8 kg)   SpO2 99%   BMI 33.77 kg/m    Physical Exam Constitutional:      Appearance: Normal appearance.  HENT:     Head: Normocephalic and  atraumatic.     Comments: Pharynx: Mild cobblestoning noted.  Eyes normal, ears normal, nose: Bilateral lower turbinates moderately edematous and pale with clear drainage noted    Right Ear: Tympanic membrane, ear canal and external ear normal.     Left Ear: Tympanic membrane, ear canal and external ear normal.     Mouth/Throat:     Mouth: Mucous membranes are moist.     Pharynx: Oropharynx is clear.  Eyes:     Conjunctiva/sclera: Conjunctivae normal.  Cardiovascular:     Rate and Rhythm: Regular rhythm.     Heart sounds: Normal heart sounds.  Pulmonary:     Effort: Pulmonary effort is normal.     Breath sounds: Normal breath sounds.     Comments: Lungs clear to auscultation Musculoskeletal:     Cervical back: Neck supple.  Skin:    General: Skin is warm.  Neurological:     Mental Status: She is alert and oriented to person, place, and time.  Psychiatric:        Mood and Affect: Mood normal.        Behavior: Behavior normal.        Thought Content: Thought content normal.        Judgment: Judgment normal.     Diagnostics:  None. Will get at next office visit.  Assessment and Plan: 1. Acute non-recurrent sinusitis, unspecified location   2. Mild intermittent asthma without complication   3. Seasonal and perennial allergic rhinitis   4. Seasonal allergic conjunctivitis     Meds ordered this encounter  Medications   predniSONE (DELTASONE) 10 MG tablet    Sig: 1 tablet by mouth twice a day for 4 days, then on the fifth day take 1 tablet and stop    Dispense:  9 tablet    Refill:  0    Take one  tablet twice a day for 4 days then one tablet on day 5   montelukast (SINGULAIR) 10 MG tablet    Sig: Take 1 tablet (10 mg total) by mouth at bedtime.    Dispense:  90 tablet    Refill:  1    Patient Instructions  Acute sinus infection Start Augmentin 875 mg taking 1 tablet twice a day for 7 days that was prescribed by the ER on 09/10/22 for your dog bite. This antibiotic  will also work for your sinuses. Make sure to pick this up from the pharmacy Start prednisone 10 mg taking 1 tablet twice a day for 4 days, then on the fifth day take 1 tablet and stop  Seasonal and perennial rhinitis: - Continue Avoidance measures for trees, grass, weeds, cat, dog, mold, dust mite - AIT PLAN: We will continue with  once a week buildup on current vials. Do not get your allergy injections while you are sick    -Continue to carry EpiPen on shot days  -Once your vials expire consider getting skin testing at our office since you previously lived in [place] and this is where your serum is from - Continue Zyrtec (cetirizine) 10 mg table once daily andrestart Singulair (montelukast) '10mg'$  daily - You can use an extra dose of the antihistamine, if needed, for breakthrough symptoms.  - Consider nasal saline rinses 1-2 times daily to remove allergens from the nasal cavities as well as help with mucous clearance (this is especially helpful to do before the nasal sprays are given) - Consider allergy shots as a means of long-term control and can reduce lifetime use of medications  - We can discuss more at the next appointment if the medications are not working for you.  Mild intermittent asthma:  PLAN: . - Daily controller medication(s):Restart Singulair '10mg'$  daily - Prior to physical activity: albuterol 2 puffs 10-15 minutes before physical activity. - Rescue medications: albuterol 4 puffs every 4-6 hours as needed - Asthma control goals:  * Full participation in all desired activities (may need albuterol before activity) * Albuterol use two time or less a week on average (not counting use with activity) * Cough interfering with sleep two time or less a month * Oral steroids no more than once a year * No hospitalizations  We will refer you to [facility] for possible sleep apnea due to witnessed apneas and fatigue when waking up in the morning  Follow up: 2 months or  sooner if needed      Return in about 2 months (around 11/12/2022), or if symptoms worsen or fail to improve.    Thank you for the opportunity to care for this patient.  Please do not hesitate to contact me with questions.  Althea Charon, FNP [facility]

## 2022-09-18 ENCOUNTER — Ambulatory Visit (INDEPENDENT_AMBULATORY_CARE_PROVIDER_SITE_OTHER): Payer: Federal, State, Local not specified - PPO

## 2022-09-18 DIAGNOSIS — J309 Allergic rhinitis, unspecified: Secondary | ICD-10-CM | POA: Diagnosis not present

## 2022-09-26 ENCOUNTER — Telehealth: Payer: Self-pay

## 2022-09-26 NOTE — Telephone Encounter (Signed)
-----   Message from Althea Charon, [place] sent at 09/12/2022  1:09 PM EDT ----- Please refer to [facility] for possible sleep apena- Due to witnessed apneas and waking up really tired some morntings

## 2022-09-27 ENCOUNTER — Ambulatory Visit (INDEPENDENT_AMBULATORY_CARE_PROVIDER_SITE_OTHER): Payer: Federal, State, Local not specified - PPO

## 2022-09-27 DIAGNOSIS — J309 Allergic rhinitis, unspecified: Secondary | ICD-10-CM | POA: Diagnosis not present

## 2022-10-08 ENCOUNTER — Ambulatory Visit (INDEPENDENT_AMBULATORY_CARE_PROVIDER_SITE_OTHER): Payer: Federal, State, Local not specified - PPO

## 2022-10-08 DIAGNOSIS — J309 Allergic rhinitis, unspecified: Secondary | ICD-10-CM | POA: Diagnosis not present

## 2022-10-28 ENCOUNTER — Ambulatory Visit (INDEPENDENT_AMBULATORY_CARE_PROVIDER_SITE_OTHER): Payer: Federal, State, Local not specified - PPO | Admitting: Neurology

## 2022-10-28 ENCOUNTER — Encounter: Payer: Self-pay | Admitting: Neurology

## 2022-10-28 VITALS — BP 130/77 | HR 61 | Ht 70.0 in | Wt 241.0 lb

## 2022-10-28 DIAGNOSIS — R0681 Apnea, not elsewhere classified: Secondary | ICD-10-CM | POA: Diagnosis not present

## 2022-10-28 DIAGNOSIS — R519 Headache, unspecified: Secondary | ICD-10-CM

## 2022-10-28 DIAGNOSIS — R0683 Snoring: Secondary | ICD-10-CM | POA: Diagnosis not present

## 2022-10-28 DIAGNOSIS — G4719 Other hypersomnia: Secondary | ICD-10-CM | POA: Diagnosis not present

## 2022-10-28 DIAGNOSIS — G4730 Sleep apnea, unspecified: Secondary | ICD-10-CM

## 2022-10-28 DIAGNOSIS — E669 Obesity, unspecified: Secondary | ICD-10-CM

## 2022-10-28 NOTE — Patient Instructions (Signed)

## 2022-10-28 NOTE — Progress Notes (Signed)
Subjective:    Patient ID: Suzanne Bowen is a 46 y.o. female.  HPI    Star Age, MD, PhD [facility] [address], [address], [place] [address]  Dear Altha Harm,   I saw your patient, Suzanne Bowen, upon your kind request, in my Sleep clinic today for initial consultation of her sleep disorder, in particular, concern for underlying obstructive sleep apnea.  The patient is unaccompanied today.  As you know, [name] is a 46 year old female with an underlying medical history of allergies, asthma, PCOS, right ovarian mass, sacroiliac pain, left knee torn meniscus, vitamin D deficiency, and obesity, who reports snoring and excessive daytime somnolence as well as witnessed apneas.  I reviewed your office note from 09/12/2022.  Her Epworth sleepiness score is 14 out of 24, fatigue severity score is 45 out of 63.  She reports an approximately 3-year history of feeling tired during the day.  She has never had a sleep study.  She does not wake herself up from her snoring or gasping but has been told that she makes choking sounds.  She has trouble breathing from her nose, had nasal surgery twice, in 2005 and 2010.  She had septoplasty, polypectomy, turbinate surgery and rhinoplasty.  She is working on weight loss, in the past couple of months she has lost about 10 pounds.  She is single, divorced, she lives alone, no children.  She was diagnosed with low vitamin D in the past.  She drinks caffeine in the form of coffee, 1 cup in the morning, she has worked nights in the past, currently works day shift.  She is an Financial trader.  Bedtime is generally around 8 PM and rise time somewhere between 4:30 AM and 6 AM.  She has a history of jaw cramping or bruxism and had a bite guard but currently does not have 1.  She is not aware of any family history of sleep apnea.  She denies night to night nocturia but has woken up occasionally with a  headache.  She has no pets in the household.  She drinks alcohol rarely.  Her Past Medical History Is Significant For: Past Medical History:  Diagnosis Date   History of PCOS    History of torn meniscus of left knee    Ovarian mass, right 09/21/2021   Sacroiliac pain    Vitamin D deficiency     Her Past Surgical History Is Significant For: Past Surgical History:  Procedure Laterality Date   ABDOMINAL HYSTERECTOMY  10/2021   partial, left ovaries;  6 lb tumor removed   NASAL POLYP SURGERY     septoplasty[place]    Her Family History Is Significant For: Family History  Problem Relation Age of Onset   Other Mother        leg thrombosis   High blood pressure Father    Glaucoma Maternal Grandmother    Sleep apnea Neg Hx     Her Social History Is Significant For: Social History   Socioeconomic History   Marital status: Single    Spouse name: Not on file   Number of children: Not on file   Years of education: Not on file   Highest education level: Not on file  Occupational History   Not on file  Tobacco Use   Smoking status: Former    Years: 4.00    Types: Cigarettes   Smokeless tobacco: Never  Vaping Use   Vaping  Use: Not on file  Substance and Sexual Activity   Alcohol use: Not Currently    Comment: socially   Drug use: Never   Sexual activity: Not on file  Other Topics Concern   Not on file  Social History Narrative   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    Lives alone   Right handed   Caffeine: 1 cup in the morning    Social Determinants of Health   Financial Resource Strain: Not on file  Food Insecurity: Not on file  Transportation Needs: Not on file  Physical Activity: Not on file  Stress: Not on file  Social Connections: Not on file    Her Allergies Are:  Allergies  Allergen Reactions   Shellfish Allergy     Has had one positive allergy test before and most recently two negatives  :   Her Current  Medications Are:  Outpatient Encounter Medications as of 10/28/2022  Medication Sig   albuterol (VENTOLIN HFA) 108 (90 Base) MCG/ACT inhaler Inhale 2 puffs into the lungs every 6 (six) hours as needed for wheezing or shortness of breath.   ALBUTEROL IN Inhale 2 puffs into the lungs every 4 (four) hours as needed.   budesonide-formoterol (SYMBICORT) 160-4.5 MCG/ACT inhaler Inhale into the lungs.   EPINEPHrine 0.3 mg/0.3 mL IJ SOAJ injection Inject 0.3 mg into the muscle as needed for anaphylaxis.   fexofenadine (ALLEGRA) 180 MG tablet Take 1 tablet (180 mg total) by mouth daily. (Patient taking differently: Take 180 mg by mouth daily. When needed only)   fluticasone (FLONASE) 50 MCG/ACT nasal spray Place into both nostrils daily. As needed   methocarbamol (ROBAXIN) 500 MG tablet Take 1 tablet (500 mg total) by mouth at bedtime as needed for muscle spasms.   Cetirizine HCl (ZYRTEC PO) Take by mouth. (Patient not taking: Reported on 10/28/2022)   diphenhydrAMINE HCl (BENADRYL ALLERGY PO) Take by mouth. (Patient not taking: Reported on 10/28/2022)   montelukast (SINGULAIR) 10 MG tablet Take 1 tablet (10 mg total) by mouth at bedtime. (Patient not taking: Reported on 10/28/2022)   [DISCONTINUED] amoxicillin-clavulanate (AUGMENTIN) 875-125 MG tablet Take 1 tablet by mouth every 12 (twelve) hours. (Patient not taking: Reported on 10/28/2022)   [DISCONTINUED] Fexofenadine HCl (ALLEGRA PO) Take by mouth.   [DISCONTINUED] predniSONE (DELTASONE) 10 MG tablet 1 tablet by mouth twice a day for 4 days, then on the fifth day take 1 tablet and stop (Patient not taking: Reported on 10/28/2022)   No facility-administered encounter medications on file as of 10/28/2022.  :   Review of Systems:  Out of a complete 14 point review of systems, all are reviewed and negative with the exception of these symptoms as listed below:   Review of Systems  Neurological:        Patient is here alone for sleep consult. She  reports she has been having trouble sleeping at night and is tired during the day. She is told she snores. She denies having any history of sleep study. She has had nasal surgery before, last in 2010. Surgeries include polyp removal, septoplasty, rhinoplasty. ESS 14 and FSS 45.    Objective:  Neurological Exam  Physical Exam Physical Examination:   Vitals:   10/28/22 1408  BP: 130/77  Pulse: 61    General Examination: The patient is a very pleasant 46 y.o. female in no acute distress. She appears well-developed and well-nourished and well groomed.   HEENT: Normocephalic, atraumatic, pupils are equal, round and reactive  to light, extraocular tracking is good without limitation to gaze excursion or nystagmus noted. Hearing is grossly intact. Face is symmetric with normal facial animation. Speech is clear with no dysarthria noted. There is no hypophonia. There is no lip, neck/head, jaw or voice tremor. Neck is supple with full range of passive and active motion. There are no carotid bruits on auscultation. Oropharynx exam reveals: No signal mouth dryness, good dental hygiene, mild airway crowding secondary to small airway entry.  Tonsils on the smaller side, Mallampati class I.  Neck circumference of 15-1/8 inches, minimal overbite.  Tongue protrudes centrally and palate elevates symmetrically.  Nasal inspection reveals no significant mucosal bogginess or septal deviation but she does have noisy nasal breathing.   Chest: Clear to auscultation without wheezing, rhonchi or crackles noted.  Heart: S1+S2+0, regular and normal without murmurs, rubs or gallops noted.   Abdomen: Soft, non-tender and non-distended.  Extremities: There is no obvious edema in the distal lower extremities bilaterally.   Skin: Warm and dry without trophic changes noted.   Musculoskeletal: exam reveals no obvious joint deformities.   Neurologically:  Mental status: The patient is awake, alert and oriented in all 4  spheres. Her immediate and remote memory, attention, language skills and fund of knowledge are appropriate. There is no evidence of aphasia, agnosia, apraxia or anomia. Speech is clear with normal prosody and enunciation. Thought process is linear. Mood is normal and affect is normal.  Cranial nerves II - XII are as described above under HEENT exam.  Motor exam: Normal bulk, strength and tone is noted. There is no obvious action or resting tremor.  Fine motor skills and coordination: grossly intact.  Cerebellar testing: No dysmetria or intention tremor. There is no truncal or gait ataxia.  Sensory exam: intact to light touch in the upper and lower extremities.  Gait, station and balance: She stands easily. No veering to one side is noted. No leaning to one side is noted. Posture is age-appropriate and stance is narrow based. Gait shows normal stride length and normal pace. No problems turning are noted.   Assessment and Plan:  In summary, Eevie Witter is a very pleasant 46 y.o.-year old female with an underlying medical history of allergies, asthma, PCOS, right ovarian mass, sacroiliac pain, left knee torn meniscus, vitamin D deficiency, and obesity, whose history and physical exam are concerning for sleep disordered breathing, supporting a current working diagnosis of unspecified sleep apnea, with the main differential diagnoses of obstructive sleep apnea (OSA) versus upper airway resistance syndrome (UARS) versus central sleep apnea (CSA), or mixed sleep apnea. A laboratory attended sleep study is considered gold standard for evaluation of sleep disordered breathing and is recommended at this time and clinically justified.   I had a long chat with the patient about my findings and the diagnosis of sleep apnea, particularly OSA, its prognosis and treatment options. We talked about medical/conservative treatments, surgical interventions and non-pharmacological approaches for symptom control. I  explained, in particular, the risks and ramifications of untreated moderate to severe OSA, especially with respect to developing cardiovascular disease down the road, including congestive heart failure (CHF), difficult to treat hypertension, cardiac arrhythmias (particularly A-fib), neurovascular complications including TIA, stroke and dementia. Even type 2 diabetes has, in part, been linked to untreated OSA. Symptoms of untreated OSA may include (but may not be limited to) daytime sleepiness, nocturia (i.e. frequent nighttime urination), memory problems, mood irritability and suboptimally controlled or worsening mood disorder such as depression and/or anxiety, lack  of energy, lack of motivation, physical discomfort, as well as recurrent headaches, especially morning or nocturnal headaches. We talked about the importance of maintaining a healthy lifestyle and striving for healthy weight. I recommended the following at this time: sleep study.  I outlined the differences between a laboratory attended sleep study which is considered more comprehensive and accurate over the option of a home sleep test (HST); the latter may lead to underestimation of sleep disordered breathing in some instances and does not help with diagnosing upper airway resistance syndrome and is not accurate enough to diagnose primary central sleep apnea typically. I explained the different sleep test procedures to the patient in detail and also outlined possible surgical and non-surgical treatment options of OSA, including the use of a pressure airway pressure (PAP) device (ie CPAP, AutoPAP/APAP or BiPAP in certain circumstances), a custom-made dental device (aka oral appliance, which would require a referral to a specialist dentist or orthodontist typically, and is generally speaking not considered a good choice for patients with full dentures or edentulous state), upper airway surgical options, such as traditional UPPP (which is not considered  a first-line treatment) or the Inspire device (hypoglossal nerve stimulator, which would involve a referral for consultation with an ENT surgeon, after careful selection, following inclusion criteria). I explained the PAP treatment option to the patient in detail, as this is generally considered first-line treatment.  The patient indicated that she would be willing to try PAP therapy, if the need arises. I explained the importance of being compliant with PAP treatment, not only for insurance purposes but primarily to improve patient's symptoms symptoms, and for the patient's long term health benefit, including to reduce Her cardiovascular risks longer-term.    We will pick up our discussion about the next steps and treatment options after testing.  We will keep her posted as to the test results by phone call and/or MyChart messaging where possible.  We will plan to follow-up in sleep clinic accordingly as well.  I answered all her questions today and the patient was in agreement.   I encouraged her to call with any interim questions, concerns, problems or updates or email [place] through [place].  Generally speaking, sleep test authorizations may take up to 2 weeks, sometimes less, sometimes longer, the patient is encouraged to get in touch with [place] if they do not hear back from the sleep lab staff directly within the next 2 weeks.  Thank you very much for allowing me to participate in the care of this nice patient. If I can be of any further assistance to you please do not hesitate to call me at [phone].  Sincerely,   Star Age, MD, PhD

## 2022-11-14 ENCOUNTER — Telehealth: Payer: Self-pay | Admitting: Neurology

## 2022-11-14 NOTE — Telephone Encounter (Signed)
[employer] fed pending faxed notes

## 2022-12-02 NOTE — Telephone Encounter (Signed)
11/25/22 [employer] fed Josem Kaufmann: [id number] (exp. 11/25/22 to 12/24/22) for [place].  Kelly left vmail on 11/28/22.

## 2023-02-04 DIAGNOSIS — K56609 Unspecified intestinal obstruction, unspecified as to partial versus complete obstruction: Secondary | ICD-10-CM | POA: Insufficient documentation

## 2023-02-14 ENCOUNTER — Encounter: Payer: Self-pay | Admitting: Family Medicine

## 2023-02-14 ENCOUNTER — Ambulatory Visit: Payer: Federal, State, Local not specified - PPO | Admitting: Family Medicine

## 2023-02-14 VITALS — BP 123/81 | HR 78 | Ht 70.0 in | Wt 235.0 lb

## 2023-02-14 DIAGNOSIS — Z7689 Persons encountering health services in other specified circumstances: Secondary | ICD-10-CM

## 2023-02-14 DIAGNOSIS — R42 Dizziness and giddiness: Secondary | ICD-10-CM | POA: Diagnosis not present

## 2023-02-14 DIAGNOSIS — E559 Vitamin D deficiency, unspecified: Secondary | ICD-10-CM | POA: Diagnosis not present

## 2023-02-14 DIAGNOSIS — R631 Polydipsia: Secondary | ICD-10-CM

## 2023-02-14 DIAGNOSIS — Z8719 Personal history of other diseases of the digestive system: Secondary | ICD-10-CM

## 2023-02-14 NOTE — Progress Notes (Signed)
New Patient Office Visit  Subjective    Patient ID: Suzanne Bowen, female    DOB: [birth year]  Age: 47 y.o. MRN: [id number]  CC:  Chief Complaint  Patient presents with   Establish Care    Pt c/o vertigo when laying down pt states  right ear pain in one era , pt states she felt dizzy some of the vertigo has su    HPI Suzanne Bowen presents to establish care. PT IS NEW TO ME.  Pt recently moved from [place] but she use to work all over. She is a Chief Financial Officer for [employer]  Pt reports a week ago, she was in [place] and was in a class with work. She says everyone was sick there. She had a sinus infection.  She called [facility] and was prescribed Amoxicillin x 7 days. She finished this a few Fridays ago. She started the nettipot and humidifier. She reports a few days later, she says she ate and felt sick. She started vomiting. She went to ER and was diagnosed with bowel obstruction. She was admitted and stayed in hospital from 2/19-2/22. During that time, she had  NG tube and had IVFs. She got better and when she was discharged, she flew from [place] to [place]. She started feeling dizzy on this past Monday. She reports when she lays down in bed on her left side, she has spinning sensation. She read online and started doing maneuvers to help the crystals in her ear, this improved some.    Outpatient Encounter Medications as of 02/14/2023  Medication Sig   albuterol (VENTOLIN HFA) 108 (90 Base) MCG/ACT inhaler Inhale 2 puffs into the lungs every 6 (six) hours as needed for wheezing or shortness of breath.   budesonide-formoterol (SYMBICORT) 160-4.5 MCG/ACT inhaler Inhale into the lungs.   EPINEPHrine 0.3 mg/0.3 mL IJ SOAJ injection Inject 0.3 mg into the muscle as needed for anaphylaxis.   fluticasone (FLONASE) 50 MCG/ACT nasal spray Place into both nostrils daily. As needed   ipratropium (ATROVENT) 0.03 % nasal spray Place 1 spray into both nostrils 2 (two) times daily.    methocarbamol (ROBAXIN) 500 MG tablet Take 1 tablet (500 mg total) by mouth at bedtime as needed for muscle spasms. (Patient not taking: Reported on 02/14/2023)   [DISCONTINUED] ALBUTEROL IN Inhale 2 puffs into the lungs every 4 (four) hours as needed.   [DISCONTINUED] Cetirizine HCl (ZYRTEC PO) Take by mouth. (Patient not taking: Reported on 10/28/2022)   [DISCONTINUED] diphenhydrAMINE HCl (BENADRYL ALLERGY PO) Take by mouth. (Patient not taking: Reported on 10/28/2022)   [DISCONTINUED] fexofenadine (ALLEGRA) 180 MG tablet Take 1 tablet (180 mg total) by mouth daily. (Patient taking differently: Take 180 mg by mouth daily. When needed only)   [DISCONTINUED] montelukast (SINGULAIR) 10 MG tablet Take 1 tablet (10 mg total) by mouth at bedtime. (Patient not taking: Reported on 10/28/2022)   No facility-administered encounter medications on file as of 02/14/2023.    Past Medical History:  Diagnosis Date   Asthma    GERD (gastroesophageal reflux disease)    History of PCOS    History of torn meniscus of left knee    Ovarian mass, right 09/21/2021   Sacroiliac pain    Vitamin D deficiency     Past Surgical History:  Procedure Laterality Date   ABDOMINAL HYSTERECTOMY  10/2021   partial, left ovaries;  6 lb tumor removed   NASAL POLYP SURGERY     septoplasty, rhinoplasty twice in another country  Family History  Problem Relation Age of Onset   Other Mother        leg thrombosis   High blood pressure Father    Glaucoma Maternal Grandmother    Sleep apnea Neg Hx     Social History   Socioeconomic History   Marital status: Divorced    Spouse name: Not on file   Number of children: Not on file   Years of education: Not on file   Highest education level: Not on file  Occupational History   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  Tobacco Use   Smoking status: Former    Years: 4.00    Types: Cigarettes   Smokeless tobacco: Never  Scientific laboratory technician Use: Not on file  Substance and Sexual  Activity   Alcohol use: Not Currently    Comment: socially   Drug use: Never   Sexual activity: Not on file  Other Topics Concern   Not on file  Social History Narrative   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 Negative for abdominal pain, constipation, diarrhea and vomiting.  All other systems reviewed and are negative.       Objective    BP 123/81   Pulse 78   Ht '5\' 10"'$  (1.778 m)   Wt 235 lb (106.6 kg)   SpO2 99%   BMI 33.72 kg/m   Physical Exam Vitals and nursing note reviewed.  Constitutional:      Appearance: Normal appearance. She is normal weight.  HENT:     Head: Normocephalic and atraumatic.     Right Ear: Tympanic membrane, ear canal and external ear normal.     Left Ear: Tympanic membrane, ear canal and external ear normal.     Nose: Nose normal.     Mouth/Throat:     Mouth: Mucous membranes are dry.     Pharynx: Oropharynx is clear.  Eyes:     Extraocular Movements: Extraocular movements intact.     Conjunctiva/sclera: Conjunctivae normal.     Pupils: Pupils are equal, round, and reactive to light.  Cardiovascular:     Rate and Rhythm: Normal rate and regular rhythm.     Pulses: Normal pulses.     Heart sounds: Normal heart sounds.  Pulmonary:     Effort: Pulmonary effort is normal.     Breath sounds: Normal breath sounds.  Abdominal:     General: Abdomen is flat.  Musculoskeletal:        General: Normal range of motion.  Skin:    General: Skin is warm.     Capillary Refill: Capillary refill takes less than 2 seconds.   Neurological:     General: No focal deficit present.     Mental Status: She is alert and oriented to person, place, and time. Mental status is at baseline.  Psychiatric:        Mood and Affect: Mood normal.        Behavior: Behavior normal.  Thought Content: Thought content normal.        Judgment: Judgment normal.        Assessment & Plan:   Encounter to establish care with new doctor  Polydipsia -     Hemoglobin A1c  She has increased polydipsia and nausea, will screen A1c today.   History of small bowel obstruction  Vertigo -     Basic metabolic panel  Checking BMP to rule out dehydration with recent SBO. Advised to drink more water daily with evidence of dry tongue on exam.   Vitamin D deficiency -     VITAMIN D 25 Hydroxy (Vit-D Deficiency, Fractures)  Pt reports hx of low vitamin D and would like to get this checked today      No follow-ups on file.   Leeanne Rio, MD

## 2023-02-15 LAB — BASIC METABOLIC PANEL
BUN/Creatinine Ratio: 14 (ref 9–23)
BUN: 11 mg/dL (ref 6–24)
CO2: 23 mmol/L (ref 20–29)
Calcium: 9.8 mg/dL (ref 8.7–10.2)
Chloride: 100 mmol/L (ref 96–106)
Creatinine, Ser: 0.81 mg/dL (ref 0.57–1.00)
Glucose: 68 mg/dL — ABNORMAL LOW (ref 70–99)
Potassium: 5.3 mmol/L — ABNORMAL HIGH (ref 3.5–5.2)
Sodium: 138 mmol/L (ref 134–144)
eGFR: 90 mL/min/{1.73_m2} (ref >59)

## 2023-02-15 LAB — VITAMIN D 25 HYDROXY (VIT D DEFICIENCY, FRACTURES): Vit D, 25-Hydroxy: 16.7 ng/mL — ABNORMAL LOW (ref 30.0–100.0)

## 2023-02-15 LAB — HEMOGLOBIN A1C
Est. average glucose Bld gHb Est-mCnc: 114 mg/dL
Hgb A1c MFr Bld: 5.6 % (ref 4.8–5.6)

## 2023-02-25 ENCOUNTER — Other Ambulatory Visit: Payer: Self-pay | Admitting: Family Medicine

## 2023-02-25 ENCOUNTER — Encounter: Payer: Self-pay | Admitting: Family Medicine

## 2023-02-25 DIAGNOSIS — E559 Vitamin D deficiency, unspecified: Secondary | ICD-10-CM

## 2023-02-25 MED ORDER — VITAMIN D (ERGOCALCIFEROL) 1.25 MG (50000 UNIT) PO CAPS: 50000.0000 [IU] | ORAL_CAPSULE | ORAL | 1 refills | Status: AC

## 2023-06-16 ENCOUNTER — Encounter: Payer: Federal, State, Local not specified - PPO | Admitting: Family Medicine

## 2023-06-25 ENCOUNTER — Encounter: Payer: Federal, State, Local not specified - PPO | Admitting: Family Medicine

## 2023-07-14 DIAGNOSIS — Z0000 Encounter for general adult medical examination without abnormal findings: Secondary | ICD-10-CM | POA: Diagnosis not present

## 2023-07-14 DIAGNOSIS — K56609 Unspecified intestinal obstruction, unspecified as to partial versus complete obstruction: Secondary | ICD-10-CM | POA: Diagnosis not present

## 2023-07-14 DIAGNOSIS — H8110 Benign paroxysmal vertigo, unspecified ear: Secondary | ICD-10-CM | POA: Diagnosis not present

## 2023-07-14 DIAGNOSIS — M545 Low back pain, unspecified: Secondary | ICD-10-CM | POA: Diagnosis not present

## 2023-07-14 DIAGNOSIS — B351 Tinea unguium: Secondary | ICD-10-CM | POA: Diagnosis not present

## 2023-07-14 DIAGNOSIS — R634 Abnormal weight loss: Secondary | ICD-10-CM | POA: Diagnosis not present

## 2023-07-14 DIAGNOSIS — E559 Vitamin D deficiency, unspecified: Secondary | ICD-10-CM | POA: Diagnosis not present

## 2023-07-14 DIAGNOSIS — F32A Depression, unspecified: Secondary | ICD-10-CM | POA: Diagnosis not present

## 2023-07-21 NOTE — Progress Notes (Signed)
I, Stevenson Clinch, CMA acting as a scribe for Clementeen Graham, MD.  Suzanne Bowen is a 47 y.o. female who presents to [facility] at [facility] today for low back pain x 3 months. Pt locates pain to bilateral lower back. Feels swelling in the lower back at times. Has tried foam rolling with short-term relief. Pain radiates into the left hip and leg. Does a lot of desk work with job. Hx of left knee injury with surgical repair. Feels like lower back and leg sx are affecting gate. Heat has been more helpful than ice.    Radiating pain: yes LE numbness/tingling: LE weakness: left leg Aggravates: sitting for prolonged periods of time, any position over time.  Treatments tried: stretching, foam rolling, heat, cold, topical analgesics  She also notes pain and paresthesias and pain radiating down both upper extremities.  Pertinent review of systems: No fevers or chills  Relevant historical information: History of small bowel obstruction and large uterine fibroid removed.   Exam:  BP 122/86   Pulse 69   Ht 5\' 10"  (1.778 m)   Wt 218 lb 6.4 oz (99.1 kg)   SpO2 98%   BMI 31.34 kg/m  General: Well Developed, well nourished, and in no acute distress.   MSK: C-spine nontender palpation spinal midline. Normal cervical motion. Mildly positive Spurling's test. Upper extremity strength and reflexes are intact. Wrist bilaterally negative Tinel's and Phalen's test.  L-spine nontender palpation spinal midline. Normal lumbar motion. Lower extremity strength is intact. Negative slump test.  Left hip: Normal-appearing Tender palpation greater trochanter. Minimally diminished hip abduction strength 4+/5.    Lab and Radiology Results  X-ray images cervical spine and lumbar spine obtained today personally and independently interpreted  C-spine: Minimal degenerative changes.  No acute fractures are present.  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  No acute  fractures.  Await formal radiology review     Assessment and Plan: 47 y.o. female with chronic low back pain and left lateral hip pain thought predominantly due to muscle dysfunction with some degenerative changes lumbar spine.  I think she also has trochanteric bursitis.  Additionally she has neck pain with potentially some cervical radiculopathy causing paresthesias into her hands.    Plan for physical therapy.  Recheck in about 8 weeks.  If not improved consider advanced imaging or injections.   PDMP not reviewed this encounter. Orders Placed This Encounter  Procedures   DG Lumbar Spine 2-3 Views    Standing Status:   Future    Number of Occurrences:   1    Standing Expiration Date:   07/21/2024    Order Specific Question:   Reason for Exam (SYMPTOM  OR DIAGNOSIS REQUIRED)    Answer:   eval low back pain    Order Specific Question:   Is patient pregnant?    Answer:   No    Order Specific Question:   Preferred imaging location?    Answer:   [facility]   DG Cervical Spine 2 or 3 views    Standing Status:   Future    Number of Occurrences:   1    Standing Expiration Date:   07/21/2024    Order Specific Question:   Reason for Exam (SYMPTOM  OR DIAGNOSIS REQUIRED)    Answer:   eval cervi rad    Order Specific Question:   Is patient pregnant?    Answer:   No    Order Specific Question:  Preferred imaging location?    Answer:   [facility]   Ambulatory referral to Physical Therapy    Referral Priority:   Routine    Referral Type:   Physical Medicine    Referral Reason:   Specialty Services Required    Requested Specialty:   Physical Therapy    Number of Visits Requested:   1   No orders of the defined types were placed in this encounter.    Discussed warning signs or symptoms. Please see discharge instructions. Patient expresses understanding.   The above documentation has been reviewed and is accurate and complete Clementeen Graham, M.D.

## 2023-07-22 ENCOUNTER — Encounter: Payer: Self-pay | Admitting: Family Medicine

## 2023-07-22 ENCOUNTER — Ambulatory Visit (INDEPENDENT_AMBULATORY_CARE_PROVIDER_SITE_OTHER): Payer: Federal, State, Local not specified - PPO

## 2023-07-22 ENCOUNTER — Ambulatory Visit: Payer: Federal, State, Local not specified - PPO | Admitting: Family Medicine

## 2023-07-22 VITALS — BP 122/86 | HR 69 | Ht 70.0 in | Wt 218.4 lb

## 2023-07-22 DIAGNOSIS — M542 Cervicalgia: Secondary | ICD-10-CM | POA: Diagnosis not present

## 2023-07-22 DIAGNOSIS — G8929 Other chronic pain: Secondary | ICD-10-CM

## 2023-07-22 DIAGNOSIS — M5412 Radiculopathy, cervical region: Secondary | ICD-10-CM | POA: Diagnosis not present

## 2023-07-22 DIAGNOSIS — M25552 Pain in left hip: Secondary | ICD-10-CM

## 2023-07-22 DIAGNOSIS — M545 Low back pain, unspecified: Secondary | ICD-10-CM

## 2023-07-22 DIAGNOSIS — M438X4 Other specified deforming dorsopathies, thoracic region: Secondary | ICD-10-CM | POA: Diagnosis not present

## 2023-07-22 DIAGNOSIS — M47816 Spondylosis without myelopathy or radiculopathy, lumbar region: Secondary | ICD-10-CM | POA: Diagnosis not present

## 2023-07-22 NOTE — Patient Instructions (Addendum)
Thank you for coming in today.   Please get an Xray today before you leave   I've referred you to Physical Therapy.  Let us know if you don't hear from them in one week.   Recheck in 6 weeks.   Let me know if this is not getting better or you have problems.

## 2023-07-23 DIAGNOSIS — H8110 Benign paroxysmal vertigo, unspecified ear: Secondary | ICD-10-CM | POA: Diagnosis not present

## 2023-07-23 DIAGNOSIS — H8113 Benign paroxysmal vertigo, bilateral: Secondary | ICD-10-CM | POA: Diagnosis not present

## 2023-07-23 DIAGNOSIS — M545 Low back pain, unspecified: Secondary | ICD-10-CM | POA: Diagnosis not present

## 2023-07-23 NOTE — Progress Notes (Signed)
Lumbar spine x-ray shows some arthritis.

## 2023-07-24 ENCOUNTER — Ambulatory Visit: Payer: Federal, State, Local not specified - PPO | Admitting: Physical Therapy

## 2023-08-04 NOTE — Progress Notes (Signed)
Cervical spine x-ray looks okay to radiology.

## 2023-08-06 DIAGNOSIS — F419 Anxiety disorder, unspecified: Secondary | ICD-10-CM | POA: Diagnosis not present

## 2023-08-06 DIAGNOSIS — R5383 Other fatigue: Secondary | ICD-10-CM | POA: Diagnosis not present

## 2023-08-06 DIAGNOSIS — K56609 Unspecified intestinal obstruction, unspecified as to partial versus complete obstruction: Secondary | ICD-10-CM | POA: Diagnosis not present

## 2023-08-07 ENCOUNTER — Encounter: Payer: Self-pay | Admitting: Physical Therapy

## 2023-08-07 ENCOUNTER — Ambulatory Visit: Payer: Federal, State, Local not specified - PPO | Admitting: Physical Therapy

## 2023-08-07 DIAGNOSIS — M5459 Other low back pain: Secondary | ICD-10-CM | POA: Diagnosis not present

## 2023-08-07 DIAGNOSIS — M25552 Pain in left hip: Secondary | ICD-10-CM

## 2023-08-07 DIAGNOSIS — M25562 Pain in left knee: Secondary | ICD-10-CM

## 2023-08-07 NOTE — Therapy (Addendum)
OUTPATIENT PHYSICAL THERAPY LOWER EXTREMITY EVALUATION   Patient Name: Suzanne Bowen MRN: [id number] DOB:14-Oct-1976, 47 y.o., female Today's Date: 08/07/2023  END OF SESSION:  PT End of Session - 08/07/23 1934     Visit Number 1    Number of Visits 16    Date for PT Re-Evaluation 10/02/23    Authorization Type [employer]    PT Start Time 1305    PT Stop Time 1350    PT Time Calculation (min) 45 min    Activity Tolerance Patient tolerated treatment well    Behavior During Therapy WFL for tasks assessed/performed             Past Medical History:  Diagnosis Date   Asthma    GERD (gastroesophageal reflux disease)    History of PCOS    History of torn meniscus of left knee    Ovarian mass, right 09/21/2021   Sacroiliac pain    Vitamin D deficiency    Past Surgical History:  Procedure Laterality Date   ABDOMINAL HYSTERECTOMY  10/2021   partial, left ovaries;  6 lb tumor removed   NASAL POLYP SURGERY     septoplasty, rhin[place] twice in [place] country   Patient Active Problem List   Diagnosis Date Noted   Small bowel obstruction (HCC) 02/04/2023    PCP: Merri Brunette   REFERRING PROVIDER: Clementeen Graham   REFERRING DIAG: Low back pain, L hip pain   THERAPY DIAG:  Other low back pain  Pain in left hip  Acute pain of left knee  Rationale for Evaluation and Treatment: Rehabilitation  ONSET DATE:   SUBJECTIVE:   SUBJECTIVE STATEMENT: Pt with primary complaint of Low back pain stats se has had long standing pain for years.  Has a lot of pain in AM, does some stretches which help.  She has mild pain in L hip.  States instability feeling, and grinding in bottom of L foot.  Has had L plantar fasciitis in the past, does not feel this is the same.  Reports previous L knee surgery, was in PT for about 2 years from this, several years ago.   Also has recent diagnosis of small bowel obstruction. Reports having difficulty with treatment in the past, now having some  ptsd. She is also having trouble flying in an airplane which she needs to do for work. She will likely be taking some time off of work and going out of the country for 1 month, starting next week.   PERTINENT HISTORY: Recent dx of small bowel obstruction, previous hysterectomy, previous L knee surgery   PAIN:  Are you having pain? Yes: NPRS scale: 5/10 Pain location: low back   Pain description: sore Aggravating factors: first thing in am.  Relieving factors: stretching   Are you having pain? Yes: NPRS scale: 0/10 Pain location: L lower leg, knee  Pain description: instability, grinding  Aggravating factors: walking, bending/straightening knee  Relieving factors: standing up tall to walk.   PRECAUTIONS: None  WEIGHT BEARING RESTRICTIONS: No  FALLS:  Has patient fallen in last 6 months? No  PLOF: Independent  PATIENT GOALS:  Decreased pain   NEXT MD VISIT:   OBJECTIVE:   DIAGNOSTIC FINDINGS:   PATIENT SURVEYS:   COGNITION: Overall cognitive status: Within functional limits for tasks assessed     SENSATION: WFL  EDEMA:   POSTURE:    No Significant postural limitations  PALPATION:  Audible grinding of L knee joint with ambulation and moving from flexion to extension  Bil SIJ pain with palpation. , mild tenderness in bil glutes Mild tenderness in L gr troch.    LOWER EXTREMITY ROM: Lumbar: WFL Hips: WFL Knees: WFL  LOWER EXTREMITY MMT:  MMT Left eval Right  eval  Hip flexion 4+ 4+  Hip extension    Hip abduction 4+ 4+  Hip adduction    Hip internal rotation    Hip external rotation    Knee flexion 4 5  Knee extension 3 5  Ankle dorsiflexion    Ankle plantarflexion    Ankle inversion    Ankle eversion     (Blank rows = not tested)  LOWER EXTREMITY SPECIAL TESTS:  Severe grinding with trial for LAQ on L, and with walking (intermittent)  GAIT:    TODAY'S TREATMENT:                                                                                                                               DATE:   08/07/2023 Therapeutic Exercise: Aerobic: Supine:  Quad set x 10 on L;  SLR x 10 on L ; bridging x 10  Seated: Standing: Stretches:  pelvic tilts x 15, Piriformis fig 4 30 sec x 2 bil;  LTR x 10;  Neuromuscular Re-education: Manual Therapy: Therapeutic Activity: Self Care:    PATIENT EDUCATION:  Education details: PT POC, Exam findings, HEP Person educated: Patient Education method: Explanation, Demonstration, Tactile cues, Verbal cues, and Handouts Education comprehension: verbalized understanding, returned demonstration, verbal cues required, tactile cues required, and needs further education   HOME EXERCISE PROGRAM: Access Code: [email or web address] URL: [email or web address] Date: 08/07/2023 Prepared by: Sedalia Muta  Exercises - Supine Posterior Pelvic Tilt  - 1 x daily - 2 sets - 10 reps - Supine Figure 4 Piriformis Stretch  - 2 x daily - 3 reps - 30 hold - Supine Lower Trunk Rotation  - 2 x daily - 10 reps - 5 hold - Supine Bridge  - 1 x daily - 1-2 sets - 10 reps - Supine Quadricep Sets  - 1 x daily - 2 sets - 10 reps - Straight Leg Raise  - 1 x daily - 2 sets - 10 reps   ASSESSMENT:  CLINICAL IMPRESSION: Patient presents with primary complaint of  ongoing pain in low back. She has most pain in bil SI region. She has poor core strength and stability, and lack of effective HEP. She also has mild tenderness in L hip. She has significant grinding in L knee with walking and with ROM, with noted instability. Feeling is bothersome to her, but not very painful.  Pt with decreased ability for full functional activities. Pt will  benefit from skilled PT to improve deficits and pain and to return to PLOF. She is likely going out of country for 1 month. She will return when she gets back. We discussed f/u with MD in future for closer look at L knee.   OBJECTIVE IMPAIRMENTS: decreased activity  tolerance, decreased  mobility, difficulty walking, decreased strength, increased muscle spasms, improper body mechanics, and pain.   ACTIVITY LIMITATIONS: lifting, bending, standing, squatting, sleeping, stairs, transfers, and locomotion level  PARTICIPATION LIMITATIONS: meal prep, cleaning, driving, shopping, community activity, occupation, and yard work  PERSONAL FACTORS: Time since onset of injury/illness/exacerbation are also affecting patient's functional outcome.   REHAB POTENTIAL: Good  CLINICAL DECISION MAKING: Stable/uncomplicated  EVALUATION COMPLEXITY: Low   GOALS: Goals reviewed with patient? Yes   SHORT TERM GOALS: Target date: 08/21/2023  Pt to be independent with initial HEP  Goal status: INITIAL   LONG TERM GOALS: Target date: 10/02/2023  Pt to be independent with final HEP  Goal status: INITIAL  2.  Pt to demo improved strength of L knee to be at least 4+/5 to improve stability and gait   Goal status: INITIAL  3.  Pt to report decreased pain in low back to 0-2/10 in AM and with activity .   Goal status: INITIAL  4.  Pt to demo improved core and hip strength to be [facility] , to improve stability and pain   Goal status: INITIAL   PLAN:  PT FREQUENCY: 1-2x/week  PT DURATION: 8 weeks  PLANNED INTERVENTIONS: Therapeutic exercises, Therapeutic activity, Neuromuscular re-education, Patient/Family education, Self Care, Joint mobilization, Joint manipulation, Stair training, Orthotic/Fit training, DME instructions, Aquatic Therapy, Dry Needling, Electrical stimulation, Cryotherapy, Moist heat, Taping, Ultrasound, Ionotophoresis 4mg /ml Dexamethasone, Manual therapy,  Vasopneumatic device, Traction, Spinal manipulation, Spinal mobilization,Balance training, Gait training,   PLAN FOR NEXT SESSION: L hip strength, L quad strength, Core strength, stability   Sedalia Muta, PT, DPT 7:59 PM  08/07/23   PHYSICAL THERAPY DISCHARGE SUMMARY  Visits from Start of Care:  1   Plan: Patient agrees to discharge.  Patient goals were  not met. Patient is being discharged due to meeting the stated rehab goals.     Pt did not return to PT after Eval.

## 2023-11-12 IMAGING — US US EXTREM LOW VENOUS
1 series · 14 of 24 positions shown · non-contrast
Comparison: None Available.

CLINICAL DATA: Bilateral lower extremity swelling

EXAM:
BILATERAL LOWER EXTREMITY VENOUS DOPPLER ULTRASOUND
TECHNIQUE: Gray-scale sonography with compression, as well as color and duplex
ultrasound, were performed to evaluate the deep venous system(s)
from the level of the common femoral vein through the popliteal and
proximal calf veins.

[Series 1: us extrem low venous · 14 of 49 slices shown]
[im 1/49]
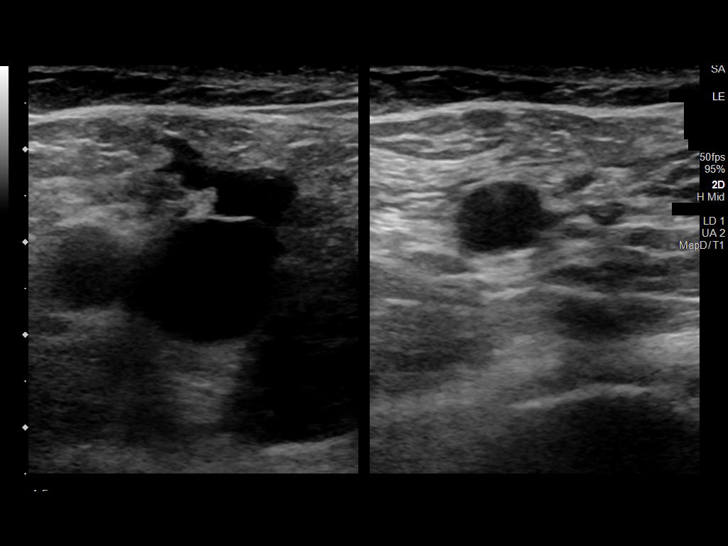
[im 5/49]
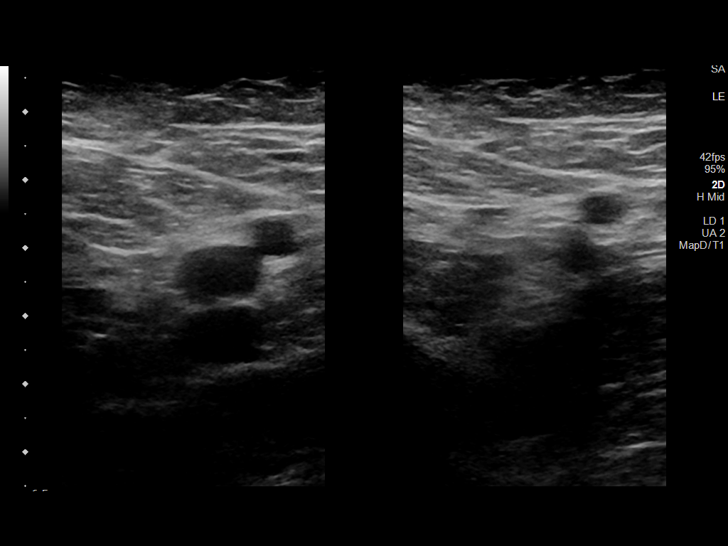
[im 9/49]
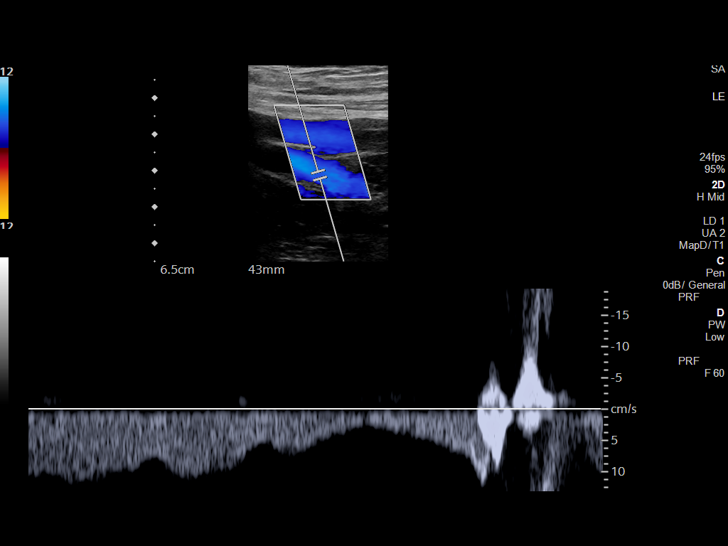
[im 13/49]
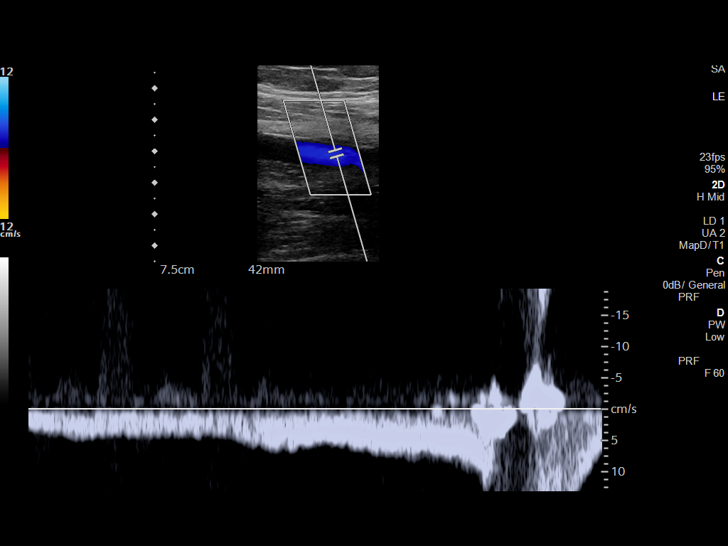
[im 15/49]
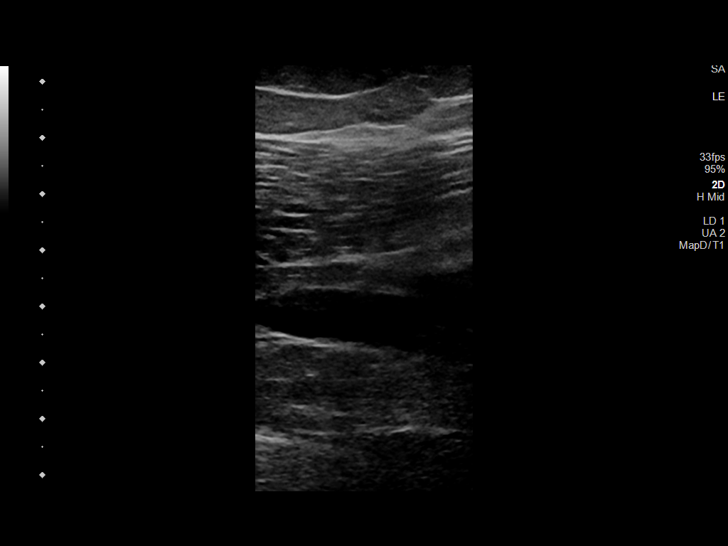
[im 19/49]
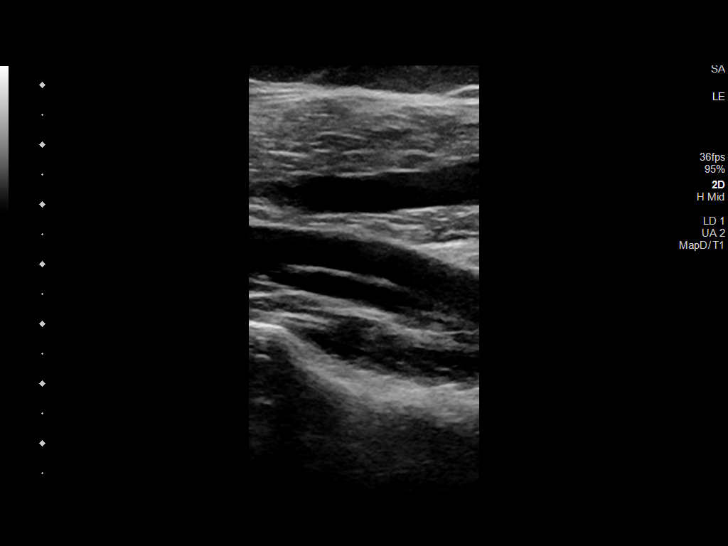
[im 23/49]
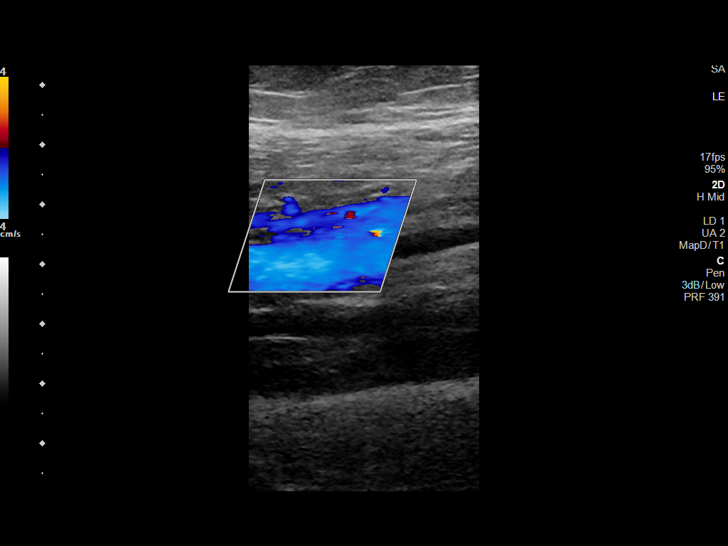
[im 26/49]
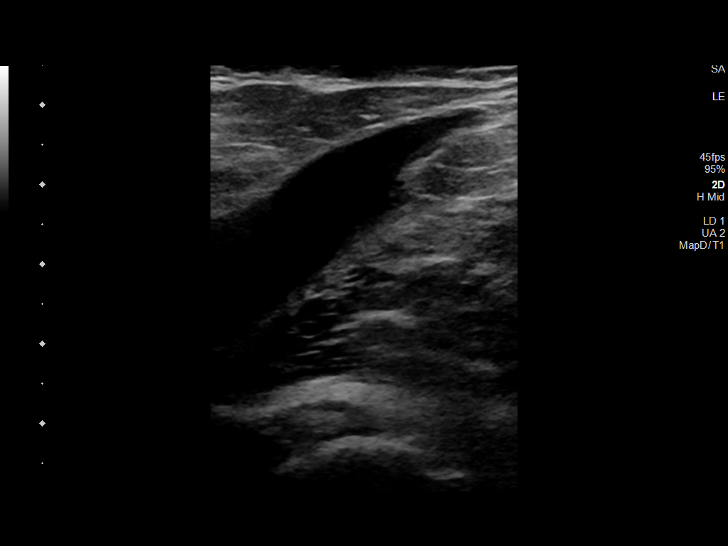
[im 30/49]
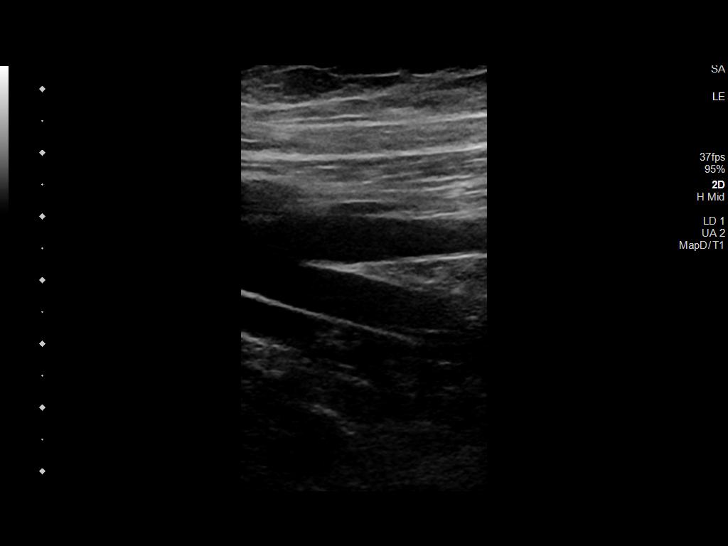
[im 34/49]
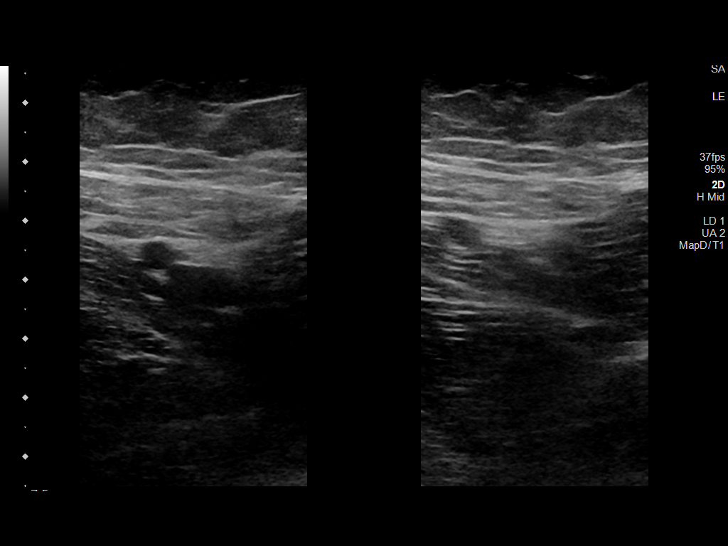
[im 38/49]
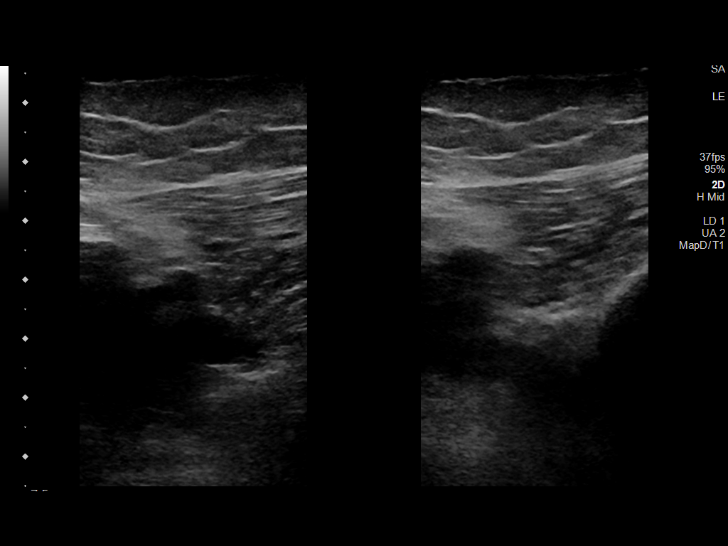
[im 40/49]
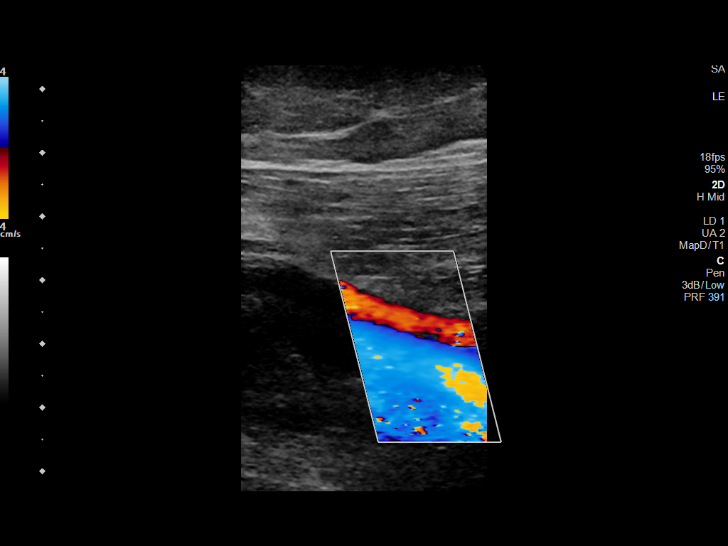
[im 44/49]
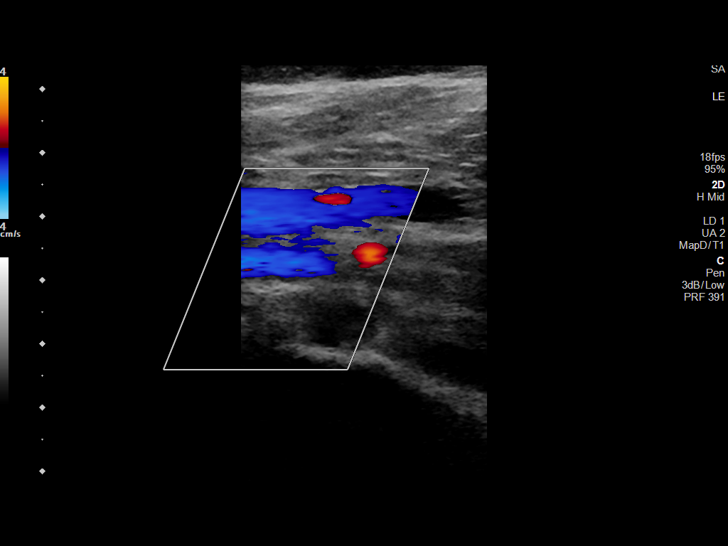
[im 49/49]
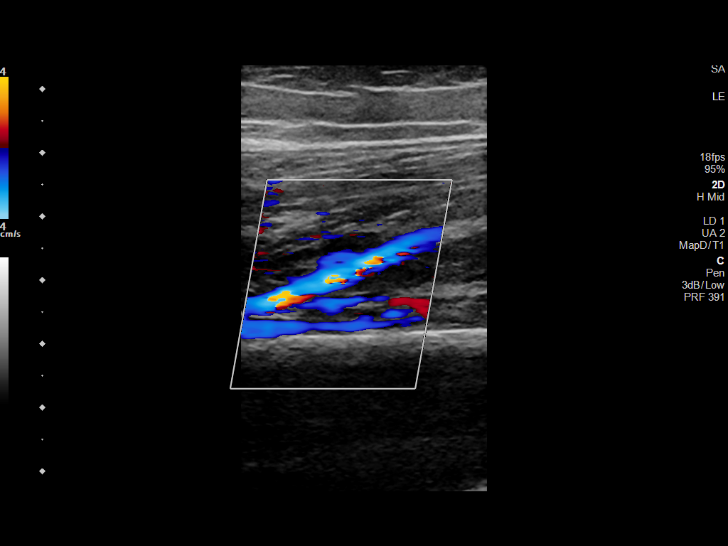

[14 of 24 positions shown; findings below may reference images not displayed]

FINDINGS: VENOUS

Normal compressibility of the common femoral, superficial femoral,
and popliteal veins, as well as the visualized calf veins.
Visualized portions of profunda femoral vein and great saphenous
vein unremarkable. No filling defects to suggest DVT on grayscale or
color Doppler imaging. Doppler waveforms show normal direction of
venous flow, normal respiratory plasticity and response to
augmentation.

OTHER

None.

Limitations: none
IMPRESSION: Negative.

## 2024-02-17 DIAGNOSIS — E039 Hypothyroidism, unspecified: Secondary | ICD-10-CM | POA: Diagnosis not present

## 2024-02-17 DIAGNOSIS — N959 Unspecified menopausal and perimenopausal disorder: Secondary | ICD-10-CM | POA: Diagnosis not present

## 2024-02-17 DIAGNOSIS — F419 Anxiety disorder, unspecified: Secondary | ICD-10-CM | POA: Diagnosis not present

## 2024-02-17 DIAGNOSIS — R519 Headache, unspecified: Secondary | ICD-10-CM | POA: Diagnosis not present

## 2024-02-17 DIAGNOSIS — E663 Overweight: Secondary | ICD-10-CM | POA: Diagnosis not present

## 2024-02-17 DIAGNOSIS — E611 Iron deficiency: Secondary | ICD-10-CM | POA: Diagnosis not present

## 2024-02-17 DIAGNOSIS — R5383 Other fatigue: Secondary | ICD-10-CM | POA: Diagnosis not present

## 2024-02-17 DIAGNOSIS — E559 Vitamin D deficiency, unspecified: Secondary | ICD-10-CM | POA: Diagnosis not present

## 2024-02-17 DIAGNOSIS — Z131 Encounter for screening for diabetes mellitus: Secondary | ICD-10-CM | POA: Diagnosis not present

## 2024-02-17 DIAGNOSIS — D519 Vitamin B12 deficiency anemia, unspecified: Secondary | ICD-10-CM | POA: Diagnosis not present

## 2024-02-17 DIAGNOSIS — R4189 Other symptoms and signs involving cognitive functions and awareness: Secondary | ICD-10-CM | POA: Diagnosis not present

## 2024-03-02 DIAGNOSIS — L709 Acne, unspecified: Secondary | ICD-10-CM | POA: Diagnosis not present

## 2024-03-02 DIAGNOSIS — H8110 Benign paroxysmal vertigo, unspecified ear: Secondary | ICD-10-CM | POA: Diagnosis not present

## 2024-03-03 DIAGNOSIS — H8113 Benign paroxysmal vertigo, bilateral: Secondary | ICD-10-CM | POA: Diagnosis not present

## 2024-03-16 DIAGNOSIS — N959 Unspecified menopausal and perimenopausal disorder: Secondary | ICD-10-CM | POA: Diagnosis not present

## 2024-03-16 DIAGNOSIS — E611 Iron deficiency: Secondary | ICD-10-CM | POA: Diagnosis not present

## 2024-03-16 DIAGNOSIS — E039 Hypothyroidism, unspecified: Secondary | ICD-10-CM | POA: Diagnosis not present

## 2024-03-16 DIAGNOSIS — R5383 Other fatigue: Secondary | ICD-10-CM | POA: Diagnosis not present

## 2024-06-01 DIAGNOSIS — E039 Hypothyroidism, unspecified: Secondary | ICD-10-CM | POA: Diagnosis not present

## 2024-06-01 DIAGNOSIS — E611 Iron deficiency: Secondary | ICD-10-CM | POA: Diagnosis not present

## 2024-06-01 DIAGNOSIS — R5383 Other fatigue: Secondary | ICD-10-CM | POA: Diagnosis not present

## 2024-06-01 DIAGNOSIS — N959 Unspecified menopausal and perimenopausal disorder: Secondary | ICD-10-CM | POA: Diagnosis not present

## 2024-06-22 DIAGNOSIS — Z319 Encounter for procreative management, unspecified: Secondary | ICD-10-CM | POA: Diagnosis not present

## 2024-06-22 DIAGNOSIS — Z202 Contact with and (suspected) exposure to infections with a predominantly sexual mode of transmission: Secondary | ICD-10-CM | POA: Diagnosis not present

## 2024-06-22 DIAGNOSIS — N979 Female infertility, unspecified: Secondary | ICD-10-CM | POA: Diagnosis not present

## 2024-07-07 DIAGNOSIS — E559 Vitamin D deficiency, unspecified: Secondary | ICD-10-CM | POA: Diagnosis not present

## 2024-07-07 DIAGNOSIS — E611 Iron deficiency: Secondary | ICD-10-CM | POA: Diagnosis not present

## 2024-07-07 DIAGNOSIS — N959 Unspecified menopausal and perimenopausal disorder: Secondary | ICD-10-CM | POA: Diagnosis not present

## 2024-07-07 DIAGNOSIS — E039 Hypothyroidism, unspecified: Secondary | ICD-10-CM | POA: Diagnosis not present

## 2024-07-07 DIAGNOSIS — R4189 Other symptoms and signs involving cognitive functions and awareness: Secondary | ICD-10-CM | POA: Diagnosis not present

## 2024-07-07 DIAGNOSIS — R5383 Other fatigue: Secondary | ICD-10-CM | POA: Diagnosis not present

## 2024-07-08 DIAGNOSIS — E039 Hypothyroidism, unspecified: Secondary | ICD-10-CM | POA: Diagnosis not present

## 2024-07-08 DIAGNOSIS — E611 Iron deficiency: Secondary | ICD-10-CM | POA: Diagnosis not present

## 2024-07-08 DIAGNOSIS — N959 Unspecified menopausal and perimenopausal disorder: Secondary | ICD-10-CM | POA: Diagnosis not present

## 2024-07-08 DIAGNOSIS — R300 Dysuria: Secondary | ICD-10-CM | POA: Diagnosis not present

## 2024-07-08 DIAGNOSIS — R5383 Other fatigue: Secondary | ICD-10-CM | POA: Diagnosis not present

## 2024-07-08 DIAGNOSIS — Z131 Encounter for screening for diabetes mellitus: Secondary | ICD-10-CM | POA: Diagnosis not present

## 2024-07-14 DIAGNOSIS — Z0000 Encounter for general adult medical examination without abnormal findings: Secondary | ICD-10-CM | POA: Diagnosis not present

## 2024-07-27 DIAGNOSIS — Z01419 Encounter for gynecological examination (general) (routine) without abnormal findings: Secondary | ICD-10-CM | POA: Diagnosis not present

## 2024-07-27 DIAGNOSIS — E559 Vitamin D deficiency, unspecified: Secondary | ICD-10-CM | POA: Diagnosis not present

## 2024-07-30 ENCOUNTER — Other Ambulatory Visit: Payer: Self-pay

## 2024-07-30 ENCOUNTER — Emergency Department (HOSPITAL_BASED_OUTPATIENT_CLINIC_OR_DEPARTMENT_OTHER)

## 2024-07-30 ENCOUNTER — Encounter (HOSPITAL_BASED_OUTPATIENT_CLINIC_OR_DEPARTMENT_OTHER): Payer: Self-pay | Admitting: Emergency Medicine

## 2024-07-30 ENCOUNTER — Emergency Department (HOSPITAL_BASED_OUTPATIENT_CLINIC_OR_DEPARTMENT_OTHER): Admission: EM | Admit: 2024-07-30 | Discharge: 2024-07-31 | Disposition: A | Discharge status: Home / Self Care

## 2024-07-30 DIAGNOSIS — R112 Nausea with vomiting, unspecified: Secondary | ICD-10-CM | POA: Insufficient documentation

## 2024-07-30 DIAGNOSIS — N83202 Unspecified ovarian cyst, left side: Secondary | ICD-10-CM

## 2024-07-30 DIAGNOSIS — R1032 Left lower quadrant pain: Secondary | ICD-10-CM | POA: Insufficient documentation

## 2024-07-30 DIAGNOSIS — R1012 Left upper quadrant pain: Secondary | ICD-10-CM | POA: Insufficient documentation

## 2024-07-30 DIAGNOSIS — R109 Unspecified abdominal pain: Secondary | ICD-10-CM | POA: Diagnosis not present

## 2024-07-30 LAB — CBC
HCT: 42.6 % (ref 36.0–46.0)
Hemoglobin: 14.3 g/dL (ref 12.0–15.0)
MCH: 27.4 pg (ref 26.0–34.0)
MCHC: 33.6 g/dL (ref 30.0–36.0)
MCV: 81.6 fL (ref 80.0–100.0)
Platelets: 377 K/uL (ref 150–400)
RBC: 5.22 MIL/uL — ABNORMAL HIGH (ref 3.87–5.11)
RDW: 13.2 % (ref 11.5–15.5)
WBC: 11.6 K/uL — ABNORMAL HIGH (ref 4.0–10.5)
nRBC: 0 % (ref 0.0–0.2)

## 2024-07-30 LAB — COMPREHENSIVE METABOLIC PANEL WITH GFR
ALT: 12 U/L (ref 0–44)
AST: 20 U/L (ref 15–41)
Albumin: 4.3 g/dL (ref 3.5–5.0)
Alkaline Phosphatase: 72 U/L (ref 38–126)
Anion gap: 13 (ref 5–15)
BUN: 14 mg/dL (ref 6–20)
CO2: 22 mmol/L (ref 22–32)
Calcium: 9.5 mg/dL (ref 8.9–10.3)
Chloride: 102 mmol/L (ref 98–111)
Creatinine, Ser: 0.87 mg/dL (ref 0.44–1.00)
GFR, Estimated: >60 mL/min (ref >60)
Glucose, Bld: 137 mg/dL — ABNORMAL HIGH (ref 70–99)
Potassium: 3.8 mmol/L (ref 3.5–5.1)
Sodium: 137 mmol/L (ref 135–145)
Total Bilirubin: 0.6 mg/dL (ref 0.0–1.2)
Total Protein: 7.2 g/dL (ref 6.5–8.1)

## 2024-07-30 LAB — URINALYSIS, ROUTINE W REFLEX MICROSCOPIC
Bilirubin Urine: NEGATIVE
Glucose, UA: NEGATIVE mg/dL
Hgb urine dipstick: NEGATIVE
Ketones, ur: 40 mg/dL — AB
Leukocytes,Ua: NEGATIVE
Nitrite: NEGATIVE
Protein, ur: 30 mg/dL — AB
Specific Gravity, Urine: 1.015 (ref 1.005–1.030)
pH: 8.5 — ABNORMAL HIGH (ref 5.0–8.0)

## 2024-07-30 LAB — URINALYSIS, MICROSCOPIC (REFLEX): RBC / HPF: NONE SEEN RBC/hpf (ref 0–5)

## 2024-07-30 LAB — LIPASE, BLOOD: Lipase: 25 U/L (ref 11–51)

## 2024-07-30 LAB — PREGNANCY, URINE: Preg Test, Ur: NEGATIVE

## 2024-07-30 MED ORDER — IOHEXOL 300 MG/ML  SOLN
100.0000 mL | Freq: Once | INTRAMUSCULAR | Status: AC | PRN
  Administered 2024-07-30: 100 mL via INTRAVENOUS

## 2024-07-30 MED ORDER — SODIUM CHLORIDE 0.9 % IV BOLUS
1000.0000 mL | Freq: Once | INTRAVENOUS | Status: AC
  Administered 2024-07-30: 1000 mL via INTRAVENOUS

## 2024-07-30 MED ORDER — ALUM & MAG HYDROXIDE-SIMETH 200-200-20 MG/5ML PO SUSP
30.0000 mL | Freq: Once | ORAL | Status: AC
  Administered 2024-07-30: 30 mL via ORAL
  Filled 2024-07-30: qty 30

## 2024-07-30 MED ORDER — ONDANSETRON HCL 4 MG/2ML IJ SOLN
4.0000 mg | Freq: Once | INTRAMUSCULAR | Status: AC
  Administered 2024-07-30: 4 mg via INTRAVENOUS
  Filled 2024-07-30: qty 2

## 2024-07-30 MED ORDER — LIDOCAINE VISCOUS HCL 2 % MT SOLN
15.0000 mL | Freq: Once | OROMUCOSAL | Status: AC
  Administered 2024-07-30: 15 mL via ORAL
  Filled 2024-07-30: qty 15

## 2024-07-30 NOTE — ED Provider Notes (Signed)
 [place] [facility] AT [facility] Provider Note   CSN: [id number] Arrival date & time: 07/30/24  2114     Patient presents with: Abdominal Pain and Emesis   Suzanne Bowen is a 48 y.o. female.   48 year old female presents for evaluation of abdominal pain.  She states this started today after she ate Posta that she made at home.  Admits to nausea and vomiting as well.  States is located in the left upper and left lower quadrant.  States she has had some right lower quadrant abdominal pain for a while.  Denies any other symptoms or concerns at this time.   Abdominal Pain Associated symptoms: nausea and vomiting   Associated symptoms: no chest pain, no chills, no cough, no dysuria, no fever, no hematuria, no shortness of breath and no sore throat   Emesis Associated symptoms: abdominal pain   Associated symptoms: no arthralgias, no chills, no cough, no fever and no sore throat        Prior to Admission medications   Medication Sig Start Date End Date Taking? Authorizing Provider  albuterol  (VENTOLIN  HFA) 108 (90 Base) MCG/ACT inhaler Inhale 2 puffs into the lungs every 6 (six) hours as needed for wheezing or shortness of breath. 06/06/22   Lorin Norris, MD  budesonide-formoterol [facility]) 160-4.5 MCG/ACT inhaler Inhale into the lungs. 05/06/22   [provider]  EPINEPHrine 0.3 mg/0.3 mL IJ SOAJ injection Inject 0.3 mg into the muscle as needed for anaphylaxis.    [provider]  fluticasone (FLONASE) 50 MCG/ACT nasal spray Place into both nostrils daily. As needed    [provider]  ipratropium (ATROVENT) 0.03 % nasal spray Place 1 spray into both nostrils 2 (two) times daily. 01/20/23   [provider]  methocarbamol  (ROBAXIN ) 500 MG tablet Take 1 tablet (500 mg total) by mouth at bedtime as needed for muscle spasms. 06/07/22   Caccavale, Sophia, PA-C  Vitamin D , Ergocalciferol , (DRISDOL ) 1.25 MG (50000 UNIT) CAPS  capsule Take 1 capsule (50,000 Units total) by mouth every 7 (seven) days. 02/25/23   Colette Torrence GRADE, MD    Allergies: Morphine and codeine and Shellfish allergy    Review of Systems  Constitutional:  Negative for chills and fever.  HENT:  Negative for ear pain and sore throat.   Eyes:  Negative for pain and visual disturbance.  Respiratory:  Negative for cough and shortness of breath.   Cardiovascular:  Negative for chest pain and palpitations.  Gastrointestinal:  Positive for abdominal pain, nausea and vomiting.  Genitourinary:  Negative for dysuria and hematuria.  Musculoskeletal:  Negative for arthralgias and back pain.  Skin:  Negative for color change and rash.  Neurological:  Negative for seizures and syncope.  All other systems reviewed and are negative.   Updated Vital Signs BP 137/70 (BP Location: Right Arm)   Pulse 83   Temp 98.1 F (36.7 C) (Oral)   Resp 18   Ht 5' 10 (1.778 m)   Wt 104.3 kg   SpO2 97%   BMI 33.00 kg/m   Physical Exam Vitals and nursing note reviewed.  Constitutional:      General: She is not in acute distress.    Appearance: She is well-developed. She is not ill-appearing.  HENT:     Head: Normocephalic and atraumatic.  Eyes:     Conjunctiva/sclera: Conjunctivae normal.  Cardiovascular:     Rate and Rhythm: Normal rate and regular rhythm.     Heart sounds:  No murmur heard. Pulmonary:     Effort: Pulmonary effort is normal. No respiratory distress.     Breath sounds: Normal breath sounds.  Abdominal:     Palpations: Abdomen is soft.     Tenderness: There is generalized abdominal tenderness and tenderness in the left upper quadrant and left lower quadrant.  Musculoskeletal:        General: No swelling.     Cervical back: Neck supple.  Skin:    General: Skin is warm and dry.     Capillary Refill: Capillary refill takes less than 2 seconds.  Neurological:     Mental Status: She is alert.  Psychiatric:        Mood and Affect: Mood  normal.     (all labs ordered are listed, but only abnormal results are displayed) Labs Reviewed  COMPREHENSIVE METABOLIC PANEL WITH GFR - Abnormal; Notable for the following components:      Result Value   Glucose, Bld 137 (*)    All other components within normal limits  CBC - Abnormal; Notable for the following components:   WBC 11.6 (*)    RBC 5.22 (*)    All other components within normal limits  URINALYSIS, ROUTINE W REFLEX MICROSCOPIC - Abnormal; Notable for the following components:   pH 8.5 (*)    Ketones, ur 40 (*)    Protein, ur 30 (*)    All other components within normal limits  URINALYSIS, MICROSCOPIC (REFLEX) - Abnormal; Notable for the following components:   Bacteria, UA FEW (*)    All other components within normal limits  LIPASE, BLOOD  PREGNANCY, URINE    EKG: EKG Interpretation Date/Time:  Friday July 30 2024 21:30:03 EDT Ventricular Rate:  89 PR Interval:  175 QRS Duration:  117 QT Interval:  392 QTC Calculation: 477 R Axis:   -77  Text Interpretation: Sinus rhythm Left anterior fascicular block ST elev, probable normal early repol pattern No previous for comparison Confirmed by Gennaro Bouchard ([id number]) on 07/30/2024 9:39:47 PM  Radiology: No results found.   Procedures   Medications Ordered in the ED  alum & mag hydroxide-simeth (MAALOX/MYLANTA) 200-200-20 MG/5ML suspension 30 mL (30 mLs Oral Given 07/30/24 2234)    And  lidocaine  (XYLOCAINE ) 2 % viscous mouth solution 15 mL (15 mLs Oral Given 07/30/24 2234)  ondansetron  (ZOFRAN ) injection 4 mg (4 mg Intravenous Given 07/30/24 2232)  sodium chloride  0.9 % bolus 1,000 mL (1,000 mLs Intravenous New Bag/Given 07/30/24 2240)  iohexol  (OMNIPAQUE ) 300 MG/ML solution 100 mL (100 mLs Intravenous Contrast Given 07/30/24 2257)                                    Medical Decision Making Lab workup reviewed by me and patient has no leukocytosis.  CT abdomen pelvis are pending.  She declined any sort of  pain medications in the ER even Toradol.  Will give her GI cocktail Zofran  IV fluids.  Patient signed out to oncoming provider at 11 PM pending remainder of workup and ultimate disposition.  Problems Addressed: Left upper quadrant abdominal pain: undiagnosed new problem with uncertain prognosis  Amount and/or Complexity of Data Reviewed External Data Reviewed: notes.    Details: No prior records for review Labs: ordered. Decision-making details documented in ED Course.    Details: Labs ordered and reviewed by me and she has leukocytosis but labs fairly unremarkable otherwise Radiology: ordered.  Details: CT abdomen pelvis ordered and pending  Risk OTC drugs. Prescription drug management. Drug therapy requiring intensive monitoring for toxicity.     Final diagnoses:  Left upper quadrant abdominal pain    ED Discharge Orders     None          Gennaro Duwaine CROME, DO 07/30/24 2300

## 2024-07-30 NOTE — ED Provider Notes (Signed)
 Blood pressure 137/70, pulse 83, temperature 98.1 F (36.7 C), temperature source Oral, resp. rate 18, height 5' 10 (1.778 m), weight 104.3 kg, SpO2 97%.  Assuming care from Dr. Gennaro.  In short, Suzanne Bowen is a 48 y.o. female with a chief complaint of Abdominal Pain and Emesis .  Refer to the original H&P for additional details.  The current plan of care is to f/u on CT and reassess.  12:12 AM CT without acute process.  Patient feeling improved on reassessment.  Vitals remain unremarkable.  Stable for discharge with Zofran  and Bentyl  to her pharmacy of choice.  Discussed liquid diet and strict ED return precaution.   Darra Fonda MATSU, MD 07/31/24 [phone]

## 2024-07-30 NOTE — ED Triage Notes (Signed)
 Pt via POV c/o severe cramping abdominal pain and nausea after eating lunch today; vomited 30 mins PTA. Pt notes she had acidic foods for lunch. Pt also reports burning sensation to esophagus. Prior hx GERD.

## 2024-07-31 MED ORDER — ONDANSETRON 4 MG PO TBDP: 4.0000 mg | ORAL_TABLET | Freq: Three times a day (TID) | ORAL | 0 refills | Status: AC | PRN

## 2024-07-31 MED ORDER — DICYCLOMINE HCL 20 MG PO TABS: 20.0000 mg | ORAL_TABLET | Freq: Three times a day (TID) | ORAL | 0 refills | Status: AC | PRN

## 2024-07-31 NOTE — Discharge Instructions (Signed)

## 2024-11-25 ENCOUNTER — Other Ambulatory Visit: Payer: Self-pay | Admitting: Internal Medicine

## 2024-11-25 DIAGNOSIS — Z1231 Encounter for screening mammogram for malignant neoplasm of breast: Secondary | ICD-10-CM

## 2024-12-07 ENCOUNTER — Ambulatory Visit
Admission: RE | Admit: 2024-12-07 | Discharge: 2024-12-07 | Disposition: A | Discharge status: Home / Self Care | Source: Ambulatory Visit | Attending: Internal Medicine | Admitting: Internal Medicine

## 2024-12-07 DIAGNOSIS — Z1231 Encounter for screening mammogram for malignant neoplasm of breast: Secondary | ICD-10-CM | POA: Diagnosis not present

## 2024-12-20 ENCOUNTER — Encounter: Payer: Self-pay | Admitting: Obstetrics & Gynecology

## 2024-12-20 ENCOUNTER — Ambulatory Visit: Admitting: Obstetrics & Gynecology

## 2024-12-20 VITALS — BP 127/78 | HR 79 | Ht 70.0 in | Wt 236.0 lb

## 2024-12-20 DIAGNOSIS — R1031 Right lower quadrant pain: Secondary | ICD-10-CM

## 2024-12-20 DIAGNOSIS — G8929 Other chronic pain: Secondary | ICD-10-CM

## 2024-12-20 MED ORDER — NORETHINDRONE ACETATE 5 MG PO TABS: 5.0000 mg | ORAL_TABLET | Freq: Every day | ORAL | 11 refills | Status: AC

## 2024-12-20 NOTE — Progress Notes (Unsigned)
 "     Chief Complaint  Patient presents with   lower abdominal pain      49 y.o. G0P0000 No LMP recorded. Patient has had a hysterectomy. The current method of family planning is {contraception:315051}.  Outpatient Encounter Medications as of 12/20/2024  Medication Sig Note   magnesium (MAGTAB) 84 MG ( ) TBCR SR tablet Take 84 mg by mouth.    norethindrone  (AYGESTIN ) 5 MG tablet Take 1 tablet (5 mg total) by mouth daily.    albuterol  (VENTOLIN  HFA) 108 (90 Base) MCG/ACT inhaler Inhale 2 puffs into the lungs every 6 (six) hours as needed for wheezing or shortness of breath.    budesonide-formoterol (SYMBICORT) 160-4.5 MCG/ACT inhaler Inhale into the lungs. 10/28/2022: Takes when needed   dicyclomine  (BENTYL ) 20 MG tablet Take 1 tablet (20 mg total) by mouth 3 (three) times daily as needed for spasms. (Patient not taking: Reported on 12/20/2024)    EPINEPHrine 0.3 mg/0.3 mL IJ SOAJ injection Inject 0.3 mg into the muscle as needed for anaphylaxis.    fluticasone (FLONASE) 50 MCG/ACT nasal spray Place into both nostrils daily. As needed    ipratropium (ATROVENT) 0.03 % nasal spray Place 1 spray into both nostrils 2 (two) times daily.    methocarbamol  (ROBAXIN ) 500 MG tablet Take 1 tablet (500 mg total) by mouth at bedtime as needed for muscle spasms.    ondansetron  (ZOFRAN -ODT) 4 MG disintegrating tablet Take 1 tablet (4 mg total) by mouth every 8 (eight) hours as needed. (Patient not taking: Reported on 12/20/2024)    Vitamin D , Ergocalciferol , (DRISDOL ) 1.25 MG (50000 UNIT) CAPS capsule Take 1 capsule (50,000 Units total) by mouth every 7 (seven) days. (Patient not taking: Reported on 12/20/2024)    No facility-administered encounter medications on file as of 12/20/2024.    Subjective *** Past Medical History:  Diagnosis Date   Asthma    GERD (gastroesophageal reflux disease)    History of PCOS    History of torn meniscus of left knee    Ovarian mass, right 09/21/2021   Sacroiliac pain     Vitamin D  deficiency     Past Surgical History:  Procedure Laterality Date   ABDOMINAL HYSTERECTOMY  10/2021   partial, left ovaries;  6 lb tumor removed   KNEE SURGERY Left 2015   NASAL POLYP SURGERY     septoplasty, rhin[place] twice in [place]    OB History     Gravida  0   Para  0   Term  0   Preterm  0   AB  0   Living  0      SAB  0   IAB  0   Ectopic  0   Multiple  0   Live Births  0           Allergies[1]  Social History   Socioeconomic History   Marital status: Divorced    Spouse name: Not on file   Number of children: Not on file   Years of education: Not on file   Highest education level: Not on file  Occupational History   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  Tobacco Use   Smoking status: Former    Types: Cigarettes   Smokeless tobacco: Never  Vaping Use   Vaping status: Never Used  Substance and Sexual Activity   Alcohol use: Not Currently    Comment: socially   Drug use: Never   Sexual activity: Not Currently    Birth control/protection: None  Other Topics Concern   Not on file  Social History Narrative   Works at [employer] as a sports coach    Lives alone   Right handed   Caffeine: 1 cup in the morning    Social Drivers of Health   Tobacco Use: Medium Risk (12/20/2024)   Patient History    Smoking Tobacco Use: Former    Smokeless Tobacco Use: Never    Passive Exposure: Not on file  Financial Resource Strain: Patient Declined (02/06/2023)   Received from [facility]   Overall Financial Resource Strain (CARDIA)    Difficulty of Paying Living Expenses: Patient declined  Food Insecurity: Patient Declined (02/06/2023)   Received from [facility]   Epic    Within the past 12 months, you worried that your food would run out before you got the money to buy more.: Patient declined    Within the past 12 months, the food you bought just didn't last and you didn't have money to get more.: Patient declined  Transportation  Needs: Patient Declined (02/06/2023)   Received from [facility]   PRAPARE - Transportation    Lack of Transportation (Medical): Patient declined    Lack of Transportation (Non-Medical): Patient declined  Physical Activity: Not on file  Stress: Not on file  Social Connections: Not on file  Depression (PHQ2-9): Medium Risk (02/14/2023)   Depression (PHQ2-9)    PHQ-2 Score: 8  Alcohol Screen: Not on file  Housing: Not on file  Utilities: Patient Declined (02/06/2023)   Received from [facility]   [facility] Utilities    Threatened with loss of utilities: Patient declined  Health Literacy: Not on file    Family History  Problem Relation Age of Onset   Other Mother        leg thrombosis   High blood pressure Father    Glaucoma Maternal Grandmother    Sleep apnea Neg Hx     Medications:      Current Medications[2]  Objective Blood pressure 127/78, pulse 79, height 5' 10 (1.778 m), weight 236 lb (107 kg).  ***  Pertinent ROS ***  Labs or studies ***    Impression + Management Plan: Diagnoses this Encounter::   ICD-10-CM   1. Chronic RLQ pain  R10.31 US  PELVIC COMPLETE WITH TRANSVAGINAL   G89.29         Medications prescribed during  this encounter: Meds ordered this encounter  Medications   norethindrone  (AYGESTIN ) 5 MG tablet    Sig: Take 1 tablet (5 mg total) by mouth daily.    Dispense:  30 tablet    Refill:  11    Labs or Scans Ordered during this encounter: Orders Placed This Encounter  Procedures   US  PELVIC COMPLETE WITH TRANSVAGINAL      Follow up Return in about 2 weeks (around 01/03/2025) for GYN sono, Follow up, with Dr Jayne.          [1]  Allergies Allergen Reactions   Morphine And Codeine     Pt states she is allergic to opioids but is unsure of the names of medications.   Shellfish Allergy     Has had one positive allergy test before and most recently two negatives  [2]  Current Outpatient Medications:    magnesium (MAGTAB) 84 MG  ( ) TBCR SR tablet, Take 84 mg by mouth., Disp: , Rfl:    norethindrone  (AYGESTIN ) 5 MG tablet, Take 1 tablet (5 mg total) by mouth daily., Disp: 30 tablet, Rfl:  11   albuterol  (VENTOLIN  HFA) 108 (90 Base) MCG/ACT inhaler, Inhale 2 puffs into the lungs every 6 (six) hours as needed for wheezing or shortness of breath., Disp: 8 g, Rfl: 2   budesonide-formoterol (SYMBICORT) 160-4.5 MCG/ACT inhaler, Inhale into the lungs., Disp: , Rfl:    dicyclomine  (BENTYL ) 20 MG tablet, Take 1 tablet (20 mg total) by mouth 3 (three) times daily as needed for spasms. (Patient not taking: Reported on 12/20/2024), Disp: 20 tablet, Rfl: 0   EPINEPHrine 0.3 mg/0.3 mL IJ SOAJ injection, Inject 0.3 mg into the muscle as needed for anaphylaxis., Disp: , Rfl:    fluticasone (FLONASE) 50 MCG/ACT nasal spray, Place into both nostrils daily. As needed, Disp: , Rfl:    ipratropium (ATROVENT) 0.03 % nasal spray, Place 1 spray into both nostrils 2 (two) times daily., Disp: , Rfl:    methocarbamol  (ROBAXIN ) 500 MG tablet, Take 1 tablet (500 mg total) by mouth at bedtime as needed for muscle spasms., Disp: 2 tablet, Rfl: 0   ondansetron  (ZOFRAN -ODT) 4 MG disintegrating tablet, Take 1 tablet (4 mg total) by mouth every 8 (eight) hours as needed. (Patient not taking: Reported on 12/20/2024), Disp: 20 tablet, Rfl: 0   Vitamin D , Ergocalciferol , (DRISDOL ) 1.25 MG (50000 UNIT) CAPS capsule, Take 1 capsule (50,000 Units total) by mouth every 7 (seven) days. (Patient not taking: Reported on 12/20/2024), Disp: 8 capsule, Rfl: 1  "

## 2024-12-29 ENCOUNTER — Other Ambulatory Visit: Payer: Self-pay | Admitting: Obstetrics & Gynecology

## 2024-12-29 DIAGNOSIS — Z90710 Acquired absence of both cervix and uterus: Secondary | ICD-10-CM

## 2024-12-29 DIAGNOSIS — G8929 Other chronic pain: Secondary | ICD-10-CM

## 2025-01-03 ENCOUNTER — Other Ambulatory Visit: Admitting: Radiology

## 2025-01-03 ENCOUNTER — Other Ambulatory Visit: Payer: Self-pay | Admitting: Obstetrics & Gynecology

## 2025-01-03 ENCOUNTER — Ambulatory Visit: Admitting: Obstetrics & Gynecology

## 2025-01-03 DIAGNOSIS — G8929 Other chronic pain: Secondary | ICD-10-CM

## 2025-01-24 ENCOUNTER — Ambulatory Visit: Admitting: Obstetrics & Gynecology

## 2025-01-24 ENCOUNTER — Other Ambulatory Visit: Admitting: Radiology
